# Patient Record
Sex: Male | Born: 1995 | Race: White | Hispanic: No | Marital: Single | Smoking: Former smoker
Health system: Southern US, Community
[De-identification: ages and names within clinical notes are randomized; demographics above are authoritative.]

## PROBLEM LIST (undated history)

## (undated) DIAGNOSIS — F419 Anxiety disorder, unspecified: Secondary | ICD-10-CM

## (undated) DIAGNOSIS — F32A Depression, unspecified: Secondary | ICD-10-CM

## (undated) DIAGNOSIS — F329 Major depressive disorder, single episode, unspecified: Secondary | ICD-10-CM

## (undated) HISTORY — DX: Major depressive disorder, single episode, unspecified: F32.9

## (undated) HISTORY — PX: FOOT FUSION: SHX956

## (undated) HISTORY — DX: Anxiety disorder, unspecified: F41.9

## (undated) HISTORY — DX: Depression, unspecified: F32.A

## (undated) HISTORY — PX: WISDOM TOOTH EXTRACTION: SHX21

---
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
## 2015-03-04 ENCOUNTER — Encounter (HOSPITAL_COMMUNITY): Payer: Self-pay | Admitting: Emergency Medicine

## 2015-03-04 ENCOUNTER — Other Ambulatory Visit: Payer: Self-pay | Admitting: Pediatrics

## 2015-03-04 ENCOUNTER — Ambulatory Visit (INDEPENDENT_AMBULATORY_CARE_PROVIDER_SITE_OTHER): Payer: 59 | Admitting: Physician Assistant

## 2015-03-04 ENCOUNTER — Ambulatory Visit (HOSPITAL_COMMUNITY): Payer: Self-pay | Admitting: Physician Assistant

## 2015-03-04 ENCOUNTER — Emergency Department (HOSPITAL_COMMUNITY)
Admission: EM | Admit: 2015-03-04 | Discharge: 2015-03-04 | Disposition: A | Discharge status: Home / Self Care | Payer: 59 | Attending: Emergency Medicine | Admitting: Emergency Medicine

## 2015-03-04 ENCOUNTER — Encounter (HOSPITAL_COMMUNITY): Payer: Self-pay | Admitting: Physician Assistant

## 2015-03-04 VITALS — BP 116/62 | HR 75 | Ht 68.0 in | Wt 150.0 lb

## 2015-03-04 DIAGNOSIS — Z87891 Personal history of nicotine dependence: Secondary | ICD-10-CM | POA: Diagnosis not present

## 2015-03-04 DIAGNOSIS — F411 Generalized anxiety disorder: Secondary | ICD-10-CM | POA: Insufficient documentation

## 2015-03-04 DIAGNOSIS — F329 Major depressive disorder, single episode, unspecified: Secondary | ICD-10-CM | POA: Insufficient documentation

## 2015-03-04 DIAGNOSIS — F32A Depression, unspecified: Secondary | ICD-10-CM

## 2015-03-04 DIAGNOSIS — F419 Anxiety disorder, unspecified: Secondary | ICD-10-CM | POA: Diagnosis not present

## 2015-03-04 LAB — CBC WITH DIFFERENTIAL/PLATELET
Basophils Absolute: 0 10*3/uL (ref 0.0–0.1)
Basophils Relative: 0 % (ref 0–1)
EOS PCT: 3 % (ref 0–5)
Eosinophils Absolute: 0.2 10*3/uL (ref 0.0–0.7)
HCT: 46.4 % (ref 39.0–52.0)
HEMOGLOBIN: 16.2 g/dL (ref 13.0–17.0)
Lymphocytes Relative: 27 % (ref 12–46)
Lymphs Abs: 2.1 10*3/uL (ref 0.7–4.0)
MCH: 31.5 pg (ref 26.0–34.0)
MCHC: 34.9 g/dL (ref 30.0–36.0)
MCV: 90.3 fL (ref 78.0–100.0)
Monocytes Absolute: 0.8 10*3/uL (ref 0.1–1.0)
Monocytes Relative: 10 % (ref 3–12)
NEUTROS ABS: 4.5 10*3/uL (ref 1.7–7.7)
NEUTROS PCT: 60 % (ref 43–77)
PLATELETS: 208 10*3/uL (ref 150–400)
RBC: 5.14 MIL/uL (ref 4.22–5.81)
RDW: 12.2 % (ref 11.5–15.5)
WBC: 7.7 10*3/uL (ref 4.0–10.5)

## 2015-03-04 LAB — BASIC METABOLIC PANEL
ANION GAP: 10 (ref 5–15)
BUN: 15 mg/dL (ref 6–20)
CO2: 26 mmol/L (ref 22–32)
CREATININE: 1.27 mg/dL — AB (ref 0.61–1.24)
Calcium: 9.6 mg/dL (ref 8.9–10.3)
Chloride: 104 mmol/L (ref 101–111)
GFR calc Af Amer: >60 mL/min (ref >60)
GFR calc non Af Amer: >60 mL/min (ref >60)
Glucose, Bld: 97 mg/dL (ref 65–99)
Potassium: 3.6 mmol/L (ref 3.5–5.1)
Sodium: 140 mmol/L (ref 135–145)

## 2015-03-04 LAB — SALICYLATE LEVEL: Salicylate Lvl: <4 mg/dL (ref 2.8–30.0)

## 2015-03-04 LAB — ACETAMINOPHEN LEVEL: Acetaminophen (Tylenol), Serum: <10 ug/mL — ABNORMAL LOW (ref 10–30)

## 2015-03-04 LAB — ETHANOL

## 2015-03-04 NOTE — Patient Instructions (Signed)
1. [facility].

## 2015-03-04 NOTE — Discharge Instructions (Signed)
You need to have your kidney function rechecked by her primary care doctor in one week.   Behavioral Health Resources in the [facility]  Intensive Outpatient Programs: [facility]      [address] [place], [place] [phone] Both a day and evening program       [facility]     [address]        [place], [place] [address] [phone]         ADS: [facility] [address] [place] [phone]  [facility] ACCESS LINE: [phone] or [phone] [address] [place], [place] [address] [email or web address]  Mobile Crisis Teams:                                        [facility]         Mobile Crisis Care Unit [phone]             [facility] [address] [place] [phone]                                         Interventionist [address]haron DeEsch [address] [place] [phone]  Self-Help/Support Groups: [facility]oc[facility] of [facility] [place] Variety of support groups [phone] (call for more info)  Narcotics Anonymous (NA) Caring Services [address] [place] - 2 meetings at this location  Residential Treatment Programs:  [facility]      [address]        [place]       [phone]         [facility] [address], [place] [address] [place], [place]  [address] [phone]  [facility]  [address] [place], [place] [address] [phone] Admissions: 8am-3pm M-F  [facility]     [address]        [place], [place] [address]       [phone]         [facility] [address] [place], [place] [phone]  [facility] [address] [place], [place] [phone]  [facility] - Intensive Outpatient      [address]  [address]     [place], [place]       [phone]         [facility] ([facility]     [address] [place], [place] [phone] or [phone]  [facility] Services ([facility])  [address] [place], [place] [phone]  [facility] [address]                                               [address] [place] [phone]  [facility] Resources: [phone]- [phone]               General Therapy  Angie FavaJulie Brannon, PhD        [address]                                       [place], [place] [address]         [phone]   Insurance  [facility]   [address] [place], [place] [address] [phone]  [facility] [address] [place], [place] [address] [phone] Insurance/Medicaid/sponsorship through [facility]  Faith and Families                                              [address]                                        [place], [place] [address]    Therapy/tele-psych/case         [phone]          [facility] [address]   [place], [place]  [address]  Adolescent/group home/case management [phone]                                           Creola CornJulia Brannon PhD       General therapy       Insurance   [phone]         Dr. Lolly MustacheArfeen Insurance [phone] M-F  [facility] Medicaid, sponsorship [phone]

## 2015-03-04 NOTE — BH Assessment (Signed)
Assessment Note  Nathaniel King. is an 19 y.o. male with history of Anxiety, Depression, and Bipolar Disorder. Patient presents to [facility] for a TTS assessment. Patient sts that he was sent to [facility] by Verne Spurr, PA. Sts that he had his first appointment with her at the [facility] Outpatient office. Patient sts that during today's visit he discussed his on-going issues with depression. Patient admits that he has felt depressed for the past several weeks (approx. 6-8 weeks). He feels hopeless, fatigue, isolates self from others, and worthless at times. He also reports vegetative symptoms (lack of grooming, bathing, and not wanting to get out of bed). Patient sts, "I eithter sleep to much or not enough". He has loss 55 pounds in the past year intentionally as a result of dieting. Stressors includes arguments with partner and "My mother driving me crazy".   Patient admits that he told Verne Spurr, PA about his suicidal ideations but doesn't feel as if he clearly explained himself during the appointment. Patient explains that he has fleeting suicidal thoughts on/off. Patient has not had any thoughts in the past week. Sts that when he has fleeting thoughts he sometimes thinks of a plan. Patient sts, "Althought think of plans..like overdosing.Marland KitchenMarland KitchenI would never actually hurt myself". Patient admits that he has thought about overdosing in the past, last thought was 1 week ago. Patient able to contract for safety today. He does not have any current suicidal thoughts and has not had any in the past week. Patient has no history of suicidal attempts/gestures. No self mutilating behaviors. No history of inpatient psychiatric treatment. No history of outpatient psychiatric treatment.   Patient denies HI and AVH's. Patient admits to a history of alcohol binges. His last drink was 1 month ago. He also has a history of THC use and last use was 2-3 months ago.   Writer discussed overall clinicals with  examining PA-C Hinton Lovely) and Julieanne Cotton, NP. Collaborative all parties agreed that patient does not meet criteria for inpatient treatment. Patient does however meet criteria for Psych IOP and he will start the program 03/10/2015.  Patient signed a contract for safety and his partner witnessed by also signing the document. Patient left [facility] in good spirits and appeared motivated to follow up with Psych IOP next week.   Axis I: Depressive Disorder, Anxiety Disorder NOS, Bipolar Disorder Axis II: Deferred Axis III:  Past Medical History  Diagnosis Date  . Anxiety   . Depression   . Bipolar disorder    Axis IV: other psychosocial or environmental problems, problems related to social environment, problems with access to health care services and problems with primary support group Axis V: 51-60 moderate symptoms  Past Medical History:  Past Medical History  Diagnosis Date  . Anxiety   . Depression   . Bipolar disorder     Past Surgical History  Procedure Laterality Date  . Foot fusion    . Wisdom tooth extraction      Family History:  Family History  Problem Relation Age of Onset  . Alcohol abuse Mother   . Bipolar disorder Mother   . Depression Mother   . Alcohol abuse Maternal Aunt   . Alcohol abuse Maternal Grandfather     Social History:  reports that he quit smoking about 2 months ago. His smoking use included Cigarettes. He smoked 0.25 packs per day. He has never used smokeless tobacco. He reports that he uses illicit drugs (Marijuana). He reports that he  does not drink alcohol.  Additional Social History:  Alcohol / Drug Use Pain Medications: SEE MAR Prescriptions: SEE MAR Over the Counter: SEE MAR Substance #1 Name of Substance 1: THC 1 - Age of First Use: 18 1 - Amount (size/oz): small amount 1 - Frequency: "Not that often" 1 - Duration: on-going 1 - Last Use / Amount: 3 months ago Substance #2 Name of Substance 2: Alcohol  2 - Age of First Use: 18 2 -  Amount (size/oz): social binges 2 - Frequency: social use only  2 - Duration: on-going  2 - Last Use / Amount: 1 month ago   CIWA: CIWA-Ar BP: 142/77 mmHg Pulse Rate: 89 COWS:    Allergies: No Known Allergies  Home Medications:  (Not in a hospital admission)  OB/GYN Status:  No LMP for male patient.  General Assessment Data [facility] T[facility] Assessment: In system Is this a Tele or Face-to-Face Assessment?: Face-to-Face Is this an Initial Assessment or a Re-assessment for this encounter?: Initial Assessment Marital status: Single Maiden name:  (n/a) Is patient pregnant?: No Pregnancy Status: No Living Arrangements: Parent Can pt return to current living arrangement?: No Admission Status: Voluntary Is patient capable of signing voluntary admission?: Yes Referral Source: Self/Family/Friend Insurance type:  [facility])     Crisis Care Plan Living Arrangements: Parent Name of Psychiatrist:  (No psychiatrist; seen today by Verne Spurr first visit) Name of Therapist:  (No therapist )  Education Status Is patient currently in school?: No Current Grade:  (n/a) Highest grade of school patient has completed:  (n/a) Name of school:  (n/a) Contact person:  (n/a)  Risk to self with the past 6 months Suicidal Ideation: No-Not Currently/Within Last 6 Months Has patient been a risk to self within the past 6 months prior to admission? : Yes Suicidal Intent: No-Not Currently/Within Last 6 Months Has patient had any suicidal intent within the past 6 months prior to admission? : Yes (patient reports on/off fleeting thoughts of SI x6 weeks) Is patient at risk for suicide?: No (patient able to contract for safety) Suicidal Plan?: No-Not Currently/Within Last 6 Months (plan to overdose 1 week ago; no current thoughts ) Has patient had any suicidal plan within the past 6 months prior to admission? : Yes Access to Means: Yes Specify Access to Suicidal Means:  (patient does  have access to medications at home ) What has been your use of drugs/alcohol within the last 12 months?:  (patient reports a hx of social alcohol binges and THC use ) Previous Attempts/Gestures: No How many times?:  (0) Other Self Harm Risks:  (patient denies ) Triggers for Past Attempts: Other (Comment) (patient denies previous triggers ) Intentional Self Injurious Behavior: None Family Suicide History: Unknown Recent stressful life event(s): Other (Comment) (argument with boyfriend & "My mom drives me crazy") Persecutory voices/beliefs?: No Depression: Yes Depression Symptoms: Feeling worthless/self pity, Feeling angry/irritable, Loss of interest in usual pleasures, Fatigue, Isolating, Tearfulness, Insomnia Substance abuse history and/or treatment for substance abuse?: No Suicide prevention information given to non-admitted patients: Not applicable  Risk to Others within the past 6 months Homicidal Ideation: No Does patient have any lifetime risk of violence toward others beyond the six months prior to admission? : No Thoughts of Harm to Others: No Current Homicidal Intent: No Current Homicidal Plan: No Access to Homicidal Means: No Identified Victim:  (nr/a) History of harm to others?: No Assessment of Violence: None Noted Violent Behavior Description:  (patient calm and  cooperative; very pleasant) Does patient have access to weapons?: No Criminal Charges Pending?: No Does patient have a court date: No Is patient on probation?: No  Psychosis Hallucinations: None noted Delusions: None noted  Mental Status Report Appearance/Hygiene: Improved, Other (Comment) (appropriate) Eye Contact: Good Motor Activity: Freedom of movement Speech: Logical/coherent Level of Consciousness: Alert Mood: Depressed Affect: Appropriate to circumstance Anxiety Level: None     ADLScreening [facility] Assessment Services) Patient's cognitive ability adequate to safely complete daily activities?:  Yes Patient able to express need for assistance with ADLs?: Yes Independently performs ADLs?: Yes (appropriate for developmental age)  Prior Inpatient Therapy Prior Inpatient Therapy: No Prior Therapy Dates:  (n/a) Prior Therapy Facilty/Provider(s):  (n/a) Reason for Treatment:  (n/a)  Prior Outpatient Therapy Prior Outpatient Therapy: Yes Prior Therapy Dates:  (first time visit 03/04/2015 with Verne SpurrNeil Mashburn, NP) Prior Therapy Facilty/Provider(s):  Dara Hoyer(Neil Masburn, NP [facility] [facility] Reason for Treatment:  (depression, anxiety, med managment ) Does patient have an ACCT team?: No Does patient have Intensive In-House Services?  : No Does patient have [facility] services? : No Does patient have P4CC services?: No  ADL Screening (condition at time of admission) Patient's cognitive ability adequate to safely complete daily activities?: Yes Is the patient deaf or have difficulty hearing?: No Does the patient have difficulty seeing, even when wearing glasses/contacts?: No Does the patient have difficulty concentrating, remembering, or making decisions?: Yes Patient able to express need for assistance with ADLs?: Yes Does the patient have difficulty dressing or bathing?: No Independently performs ADLs?: Yes (appropriate for developmental age) Does the patient have difficulty walking or climbing stairs?: No Weakness of Legs: None Weakness of Arms/Hands: None  Home Assistive Devices/Equipment Home Assistive Devices/Equipment: None    Abuse/Neglect Assessment (Assessment to be complete while patient is alone) Physical Abuse: Denies Verbal Abuse: Denies Sexual Abuse: Denies Exploitation of patient/patient's resources: Denies Self-Neglect: Denies Values / Beliefs Cultural Requests During Hospitalization: None Spiritual Requests During Hospitalization: None   Advance Directives (For Healthcare) Does patient have an advance directive?: No Would patient like information on  creating an advanced directive?: No - patient declined information    Additional Information 1:1 In Past 12 Months?: No CIRT Risk: No Elopement Risk: No Does patient have medical clearance?: Yes     Disposition:  Disposition Initial Assessment Completed for this Encounter: Yes Disposition of Patient: Other dispositions, Outpatient treatment (Patient referred to Psych IOP to start 03/10/2015) Type of outpatient treatment: Psych Intensive Outpatient  On Site Evaluation by:   Reviewed with Physician:    Melynda RipplePerry, Bradely Rudin [facility] 03/04/2015 2:32 PM

## 2015-03-04 NOTE — ED Notes (Signed)
TTS in with pt at this time.  

## 2015-03-04 NOTE — ED Provider Notes (Signed)
CSN: [id number]     Arrival date & time 03/04/15  1147 History  This chart was scribed for non-physician practitioner, Roxy Horseman, PA-C working with Tilden Fossa, MD by Doreatha Martin, ED scribe. This patient was seen in room [facility] and the patient's care was started at 12:49 PM  Chief Complaint  Patient presents with  . Depression   The history is provided by the patient. No language interpreter was used.    HPI Comments: Deshay Blumenfeld. is a 19 y.o. male with Hx of Bipolar Disorder and Depression who presents to the Emergency Department with a chief complaint of depression onset a few months ago. He reports associated SI, anxiety, and frequent feelings of hopelessness and worthlessness. He states that his plan for suicide would be overdosing, and that he would never actually do this, but the thought is there when he is at his lowest points. Pt was referred to the ED by his Psychiatrist with [facility] in [place], whom he saw today for the first time and expressed these feelings to. He denies self-harm, HI, drug abuse, and alcohol abuse. He denies any other symptoms or injuries.   Past Medical History  Diagnosis Date  . Anxiety   . Depression   . Bipolar disorder    Past Surgical History  Procedure Laterality Date  . Foot fusion    . Wisdom tooth extraction     Family History  Problem Relation Age of Onset  . Alcohol abuse Mother   . Bipolar disorder Mother   . Depression Mother   . Alcohol abuse Maternal Aunt   . Alcohol abuse Maternal Grandfather    History  Substance Use Topics  . Smoking status: Former Smoker -- 0.25 packs/day    Types: Cigarettes    Quit date: 12/25/2014  . Smokeless tobacco: Never Used  . Alcohol Use: No    Review of Systems  Musculoskeletal: Negative for arthralgias.  Skin: Negative for wound.  Psychiatric/Behavioral: Positive for suicidal ideas. Negative for self-injury. The patient is nervous/anxious.      Allergies  Review of patient's allergies indicates no known allergies.  Home Medications   Prior to Admission medications   Not on File   BP 142/77 mmHg  Pulse 89  Temp(Src) 98.6 F (37 C) (Oral)  Resp 14  SpO2 100% Physical Exam  Constitutional: He is oriented to person, place, and time. He appears well-developed and well-nourished. No distress.  HENT:  Head: Normocephalic and atraumatic.  Mouth/Throat: Oropharynx is clear and moist.  Eyes: Conjunctivae and EOM are normal. Pupils are equal, round, and reactive to light. Right eye exhibits no discharge. Left eye exhibits no discharge. No scleral icterus.  Neck: Normal range of motion. Neck supple. No JVD present. No tracheal deviation present.  Cardiovascular: Normal rate, regular rhythm and normal heart sounds.  Exam reveals no gallop and no friction rub.   No murmur heard. Pulmonary/Chest: Effort normal and breath sounds normal. No respiratory distress. He has no wheezes. He has no rales. He exhibits no tenderness.  Abdominal: Soft. He exhibits no distension and no mass. There is no tenderness. There is no rebound and no guarding.  Musculoskeletal: Normal range of motion. He exhibits no edema or tenderness.  Neurological: He is alert and oriented to person, place, and time.  Skin: Skin is warm and dry.  Psychiatric: He has a normal mood and affect. His behavior is normal. Judgment and thought content normal.  Nursing note and vitals reviewed.  ED Course  Procedures (including critical care time) DIAGNOSTIC STUDIES: Oxygen Saturation is 100% on RA, normal by my interpretation.    COORDINATION OF CARE: 12:56 PM Discussed treatment plan with pt at bedside and pt agreed to plan.   Labs Review Labs Reviewed - No data to display  Imaging Review No results found.   EKG Interpretation None     MDM   Final diagnoses:  Depression    Patient with depression, anxiety, and intermittent episodes of suicidal ideation.  No SI now. No specific plan. States that he never really killing himself. Seen by behavioral health team, who recommends discharge with outpatient follow-up. Outpatient resources have been provided. Patient understands and agrees with the plan. Labs are reassuring. Will discharge to home.  Creatinine noted to be mildly elevated at 1.27. Will recommend hydration, and recheck in 7 days.  I personally performed the services described in this documentation, which was scribed in my presence. The recorded information has been reviewed and is accurate.    Roxy HorsemanRobert Temica Righetti, PA-C 03/04/15 1420  Tilden FossaElizabeth Rees, MD 03/04/15 [phone]
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
## 2015-03-04 NOTE — Progress Notes (Signed)
Psychiatric Initial Adult Assessment   Patient Identification: Nathaniel PandaWilliam J Ems Jr. MRN:  [id number] Date of Evaluation:  03/04/2015 Referral Source: patient Chief Complaint:   Chief Complaint    Establish Care; Depression; Anxiety     Visit Diagnosis:    ICD-9-CM ICD-10-CM   1. Generalized anxiety disorder 300.02 F41.1   2. Depression 311 F32.9    Diagnosis:   Patient Active Problem List   Diagnosis Date Noted  . Generalized anxiety disorder [F41.1] 03/04/2015  . Depression [F32.9] 03/04/2015   History of Present Illness: Patient notes that he has had depression in the past, and is not currently being treated. He states that both his sister and his partner have noticed that he doesn't seem to be himself, he reports feeling apathetic, he has been isolating himself, feeling more depressed, some crying, not getting out of bed, not taking care of himself all of this worsening in the past 6 weeks. He notes it is worse during the Summer. He reports suicidal ideation for weeks now and has a plan to overdose. He denies previous suicide attempts, but has engaged in self harm in the past in the form of cutting. He states he hasn't done so in a while.    He states this is one of the worst episodes of depression he has had and rates it as a 8-9/10.    He denies AVH, but states he has a chronic state of anxiety.  Elements:  Location:  Out patient . Quality:  Acute. Severity:  Severe. Timing:  worsening over the previous 6 weeks.. Duration:  "years". Context:  patient now suicidal with a plan and access. Associated Signs/Symptoms: Depression Symptoms:  depressed mood, anhedonia, fatigue, feelings of worthlessness/guilt, hopelessness, recurrent thoughts of death, suicidal thoughts with specific plan, anxiety, (Hypo) Manic Symptoms:   Anxiety Symptoms:   Psychotic Symptoms:   PTSD Symptoms:   Past Medical History:  Past Medical History  Diagnosis Date  . Anxiety   . Depression   .  Bipolar disorder     Past Surgical History  Procedure Laterality Date  . Foot fusion    . Wisdom tooth extraction     Family History:  Family History  Problem Relation Age of Onset  . Alcohol abuse Mother   . Bipolar disorder Mother   . Depression Mother   . Alcohol abuse Maternal Aunt   . Alcohol abuse Maternal Grandfather    Social History:   History   Social History  . Marital Status: Single    Spouse Name: N/A  . Number of Children: N/A  . Years of Education: N/A   Social History Main Topics  . Smoking status: Former Smoker -- 0.25 packs/day    Types: Cigarettes    Quit date: 12/25/2014  . Smokeless tobacco: Never Used  . Alcohol Use: 1.2 - 4.2 oz/week    2-7 Cans of beer per week  . Drug Use: Yes    Special: Marijuana     Comment: 3-4 x a year  . Sexual Activity: Yes    Birth Control/ Protection: Condom   Other Topics Concern  . None   Social History Narrative  . None   Additional Social History: patient is a Consulting civil engineerstudent at [employer].  Musculoskeletal: Strength & Muscle Tone: within normal limits Gait & Station: normal Patient leans: N/A  Psychiatric Specialty Exam: HPI  ROS  Blood pressure 116/62, pulse 75, height 5\' 8"  (1.727 m), weight 150 lb (68.04 kg), SpO2 96 %.Body mass index is  22.81 kg/(m^2).  General Appearance: Well Groomed  Eye Contact:  Fair  Speech:  Normal Rate  Volume:  Decreased  Mood:  Depressed, Hopeless and Worthless  Affect:  Congruent  Thought Process:  Goal Directed, Intact and Linear  Orientation:  Full (Time, Place, and Person)  Thought Content:  WDL  Suicidal Thoughts:  Yes.  with intent/plan  Homicidal Thoughts:  No  Memory:  NA  Judgement:  Impaired  Insight:  Lacking  Psychomotor Activity:  Decreased  Concentration:  Poor  Recall:  Fair  Fund of Knowledge:Good  Language: Good  Akathisia:  No  Handed:    AIMS (if indicated):    Assets:  Communication Skills Desire for Improvement Financial  Resources/Insurance Housing Intimacy Leisure Time Physical Health Resilience Social Support Talents/Skills Transportation Vocational/Educational  ADL's:  Impaired  Cognition: WNL  Sleep:     Is the patient at risk to self?  Yes.   Has the patient been a risk to self in the past 6 months?  Yes.   Has the patient been a risk to self within the distant past?  Yes.   Is the patient a risk to others?  No. Has the patient been a risk to others in the past 6 months?  No. Has the patient been a risk to others within the distant past?  No.  Allergies:  No Known Allergies Current Medications: No current outpatient prescriptions on file.   No current facility-administered medications for this visit.    Previous Psychotropic Medications: No   Substance Abuse History in the last 12 months:    Consequences of Substance Abuse: Negative  Medical Decision Making:  Established Problem, Worsening (2)  Treatment Plan Summary: Patient is recommended for in patient treatment of his depression, with crisis management, safety, and stabilization. [facility] is notified, no beds currently, but expected after discharges. Patient is agreeable to go to [facility], where he will be cleared. I spoke with Rayfield Citizen RN in [place] who will notify Dr. Jannifer Franklin, or Julieanne Cotton of patient's impending arrival.  Patient and BF Lissa Hoard' is given address to [facility] for admission and agrees to go immediately.    Rona Ravens. Eduard Penkala RPAC 10:27 AM 03/04/2015

## 2015-03-10 ENCOUNTER — Encounter (HOSPITAL_COMMUNITY): Payer: Self-pay | Admitting: Psychiatry

## 2015-03-10 ENCOUNTER — Other Ambulatory Visit (HOSPITAL_COMMUNITY): Payer: 59 | Admitting: Psychiatry

## 2015-03-10 DIAGNOSIS — F331 Major depressive disorder, recurrent, moderate: Secondary | ICD-10-CM | POA: Insufficient documentation

## 2015-03-10 DIAGNOSIS — F411 Generalized anxiety disorder: Secondary | ICD-10-CM | POA: Diagnosis not present

## 2015-03-10 DIAGNOSIS — R45851 Suicidal ideations: Secondary | ICD-10-CM | POA: Diagnosis not present

## 2015-03-10 DIAGNOSIS — G4700 Insomnia, unspecified: Secondary | ICD-10-CM | POA: Diagnosis not present

## 2015-03-10 DIAGNOSIS — Z87891 Personal history of nicotine dependence: Secondary | ICD-10-CM | POA: Diagnosis not present

## 2015-03-10 NOTE — Progress Notes (Signed)
Psychiatric Initial Adult Assessment   Patient Identification: Nathaniel King. MRN:  [id number] Date of Evaluation:  03/10/2015 Referral Source: Verne Spurr Chief Complaint:  depression Visit Diagnosis:    ICD-9-CM ICD-10-CM   1. Depression, major, recurrent, moderate 296.32 F33.1    Diagnosis:   Patient Active Problem List   Diagnosis Date Noted  . Depression, major, recurrent, moderate [F33.1] 03/10/2015    Priority: Medium    Class: Acute  . Generalized anxiety disorder [F41.1] 03/04/2015  . Depression [F32.9] 03/04/2015   History of Present Illness:  Depressed once as an early teenager.  Currently depressed for no apparent reason.  Does live at home and there is stress with his mother who has mood lability of her own that feeds into his own irritability.  [name] of an acquaintance at college and has ruminated on that.  No major event or loss has occurred.  Has a new relationship and that has been helpful in getting him out of some of the depression.  Before that he was depressed every day, having trouble getting and staying asleep some nights and them sleeping 12 -14 hrs other days.  Avoiding friends and activities, no energy, no focus or concentration.  No suicidal ideation.  Has not seen a therapist and was referred to here because he has had suicidal thought during this episode though not today and no intent.  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  Needs to look for a job but no motivation or energy. Elements:  Location:  depression. Quality:  daily sadness and no energy. Severity:  some suicidal thinking but not every day and no intent. Timing:  no precipitants noted. Duration:  several months. Context:  as above. Associated Signs/Symptoms: Depression Symptoms:  depressed mood, anhedonia, insomnia, hypersomnia, fatigue, feelings of worthlessness/guilt, difficulty concentrating, suicidal thoughts without plan, anxiety, (Hypo)  Manic Symptoms:  Irritable Mood, Anxiety Symptoms:  Excessive Worry, Psychotic Symptoms:  none PTSD Symptoms: Negative  Past Medical History:  Past Medical History  Diagnosis Date  . Anxiety   . Depression   . Bipolar disorder     Past Surgical History  Procedure Laterality Date  . Foot fusion    . Wisdom tooth extraction     Family History:  Family History  Problem Relation Age of Onset  . Alcohol abuse Mother   . Bipolar disorder Mother   . Depression Mother   . Alcohol abuse Maternal Aunt   . Alcohol abuse Maternal Grandfather    Social History:   History   Social History  . Marital Status: Single    Spouse Name: N/A  . Number of Children: N/A  . Years of Education: N/A   Social History Main Topics  . Smoking status: Former Smoker -- 0.25 packs/day    Types: Cigarettes    Quit date: 12/25/2014  . Smokeless tobacco: Never Used  . Alcohol Use: No  . Drug Use: Yes    Special: Marijuana     Comment: 3-4 x a year  . Sexual Activity: Yes    Birth Control/ Protection: Condom   Other Topics Concern  . None   Social History Narrative   Additional Social History: none  Musculoskeletal: Strength & Muscle Tone: within normal limits Gait & Station: normal Patient leans: N/A  Psychiatric Specialty Exam: HPI  ROS  There were no vitals taken for this visit.There is no weight on file to calculate BMI.  General Appearance: Well Groomed  Eye Contact:  Good  Speech:  Clear and Coherent  Volume:  Normal  Mood:  Depressed  Affect:  Congruent  Thought Process:  Coherent and Logical  Orientation:  Full (Time, Place, and Person)  Thought Content:  Negative  Suicidal Thoughts:  No  Homicidal Thoughts:  No  Memory:  Immediate;   Good Recent;   Good Remote;   Good  Judgement:  Good  Insight:  Good  Psychomotor Activity:  Normal  Concentration:  Good  Recall:  Good  Fund of Knowledge:Good  Language: Good  Akathisia:  Negative  Handed:  Right  AIMS (if  indicated):  0  Assets:  Communication Skills Desire for Improvement Financial Resources/Insurance Housing Intimacy Leisure Time Physical Health Resilience Social Support Talents/Skills Transportation Vocational/Educational  ADL's:  Intact  Cognition: WNL  Sleep:  erratic   Is the patient at risk to self?  No. Has the patient been a risk to self in the past 6 months?  No. Has the patient been a risk to self within the distant past?  Yes.   Is the patient a risk to others?  No. Has the patient been a risk to others in the past 6 months?  No. Has the patient been a risk to others within the distant past?  No.  Allergies:  No Known Allergies Current Medications: No current outpatient prescriptions on file.   No current facility-administered medications for this visit.    Previous Psychotropic Medications: No   Substance Abuse History in the last 12 months:  No.  Consequences of Substance Abuse: Negative  Medical Decision Making:  Established Problem, Stable/Improving (1)  Treatment Plan Summary: daily group therapy.  Wants to try without medications    Benjaman Pott 6/14/20162:17 PM

## 2015-03-10 NOTE — Progress Notes (Signed)
Nathaniel King. is a 19 y.o., single, unemployed, Caucasian male, who was referred per by Verne Spurr, PA; treatment for worsening depressive symptoms with recent SI.  Currently denies SI.  Also, denies HI A/V hallucinations.  Other symptoms include:  Low energy, no motivation, increased sleep, isolation, irritability (verbally lashes out), anhedonia, poor concentration, ruminating thoughts, and feelings of hopelessness and helplessness.  States the above symptoms worsened six months ago.  Triggers/Stressors:  1)  Conflictual relationship with mother.  Reports mother has severe mood swings.  States mother is having difficulty with his sexual orientation.  Pt states she's been having difficulty since he came out in the ninth grade.  "My father just agrees with her for the sake of having peace in the home."  Pt resides with his parents; along with two other siblings.  2)  Unemployment.  States he is currently looking for a job.  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 Denies any prior psychiatric hospitalizations.  Has seen a therapist seven ago for counseling.  Saw Verne Spurr, PA for the first time last week.  Denies any suicide attempts; but admits to hx of cutting wrists at age 67. Family Hx:  Maternal Aunt and Maternal Grandfather (ETOH). Childhood:  Born in [place], [place].  Reports having a great childhood.  States he disclosed his sexual orientation in the ninth grade.  "I was close to my mother until I came out."  Reports mother pulled away from him.  States he was making good grades until high school.  "That's when I started suffering with depression.  Denied abuse or trauma. Siblings:  Two sisters (ages 63 and 72) and a 84 yo brother (64 and 64 resides within parent's home). Pt has been in a relationship with his partner for three months.  States he is very supportive along with pt's older sister. Drugs/ETOH:  Had his first drink at age 57; had two beers two months ago.  First used  THC at age 57.  States he has used THC up to four times since first use.  Denies any DUI's.  No current use of cigarettes (former smoker).  Denies any legal issues.  Pt completed all forms.  Scored 31 on the burns.  Pt will attend MH-IOP for ten days.  A:  Oriented pt.  Provided pt with an orientation folder.  Informed Verne Spurr, PA of admit.  Will refer pt to a therapist.  Encouraged support groups.  R:  Pt receptive.          Chestine Spore, RITA, CNA, M.Ed

## 2015-03-11 ENCOUNTER — Other Ambulatory Visit (HOSPITAL_COMMUNITY): Payer: 59 | Admitting: Psychiatry

## 2015-03-11 DIAGNOSIS — F331 Major depressive disorder, recurrent, moderate: Secondary | ICD-10-CM | POA: Diagnosis not present

## 2015-03-11 NOTE — Progress Notes (Signed)
    Daily Group Progress Note  Program: IOP  Group Time: 9:00-10:30  Participation Level: Active  Behavioral Response: Appropriate  Type of Therapy:  Group Therapy  Summary of Progress: Pt. Presented as quiet and anxious. Pt. Shared that he frequently feels "defeated" by his depression particularly on days when he wakes up feeling good and then his mood changes. Pt. Shared that he is challenged by all or nothing thinking and wanting to be able to see major changes quickly.     Group Time: 10:30-12:00  Participation Level:  Active  Behavioral Response: Appropriate  Type of Therapy: Psycho-education Group  Summary of Progress: Pt. Participated in discussion about developing health boundaries with family members.   Shaune Pollack, LPC

## 2015-03-11 NOTE — Progress Notes (Signed)
    Daily Group Progress Note  Program: IOP  Group Time: 9:00-10:30  Participation Level: Active  Behavioral Response: Appropriate  Type of Therapy:  Group Therapy  Summary of Progress: Pt. Met with case manager and psychiatrist.      Group Time: 10:30-12:00  Participation Level:  Active  Behavioral Response: Appropriate  Type of Therapy: Psycho-education Group  Summary of Progress: Pt. Discussed feeling pressure from his mother, awareness of relationship with mother as significant stressor. Pt. Participated in guided meditation activity.   Nancie Neas, LPC

## 2015-03-12 ENCOUNTER — Other Ambulatory Visit (HOSPITAL_COMMUNITY): Payer: 59 | Admitting: Psychiatry

## 2015-03-12 MED ORDER — BUPROPION HCL ER (XL) 150 MG PO TB24: 150.0000 mg | ORAL_TABLET | ORAL | Status: DC

## 2015-03-12 NOTE — Progress Notes (Signed)
    Daily Group Progress Note  Program: IOP  Group Time: 9:00-10:30  Participation Level: Active  Behavioral Response: Appropriate  Type of Therapy:  Group Therapy  Summary of Progress: Pt. Reported that he was feeling better than yesterday. Pt. Reports that he continues to have problems with sleep/not sleeping enough and waking at 5:00 in the morning and poor appetite.      Group Time: 10:30-12:00  Participation Level:  Active  Behavioral Response: Appropriate  Type of Therapy: Psycho-education Group  Summary of Progress: Pt. Watched [facility] video and participated in conversation about developing self-compassion.   Shaune Pollack, LPC

## 2015-03-12 NOTE — Addendum Note (Signed)
Addended by: Carolanne Grumbling D on: 03/12/2015 01:24 PM   Modules accepted: Orders

## 2015-03-13 ENCOUNTER — Other Ambulatory Visit (HOSPITAL_COMMUNITY): Payer: 59 | Admitting: Psychiatry

## 2015-03-13 DIAGNOSIS — F331 Major depressive disorder, recurrent, moderate: Secondary | ICD-10-CM | POA: Diagnosis not present

## 2015-03-13 NOTE — Progress Notes (Signed)
    Daily Group Progress Note  Program: IOP  Group Time:  9:00-11:00  Participation Level: Active  Behavioral Response: Appropriate  Type of Therapy:  Group Therapy  Summary of Progress: Pt. Watched and discussed [employer] video about self-compassion i.e., mindfulness, kindness to self, and common humanity. Pt. Discussed pattern of comparing self to others that affects his mood.      Group Time: 11:00-12:00  Participation Level:  Active  Behavioral Response: Appropriate  Type of Therapy: Psycho-education Group  Summary of Progress: Pt. Participated in grief and loss with Theda Belfast.   Shaune Pollack, LPC

## 2015-03-16 ENCOUNTER — Telehealth (HOSPITAL_COMMUNITY): Payer: Self-pay | Admitting: Psychiatry

## 2015-03-16 ENCOUNTER — Other Ambulatory Visit (HOSPITAL_COMMUNITY): Payer: 59 | Admitting: Psychiatry

## 2015-03-17 ENCOUNTER — Other Ambulatory Visit (HOSPITAL_COMMUNITY): Payer: 59 | Attending: Psychiatry | Admitting: Psychiatry

## 2015-03-17 DIAGNOSIS — F331 Major depressive disorder, recurrent, moderate: Secondary | ICD-10-CM | POA: Diagnosis not present

## 2015-03-17 DIAGNOSIS — F411 Generalized anxiety disorder: Secondary | ICD-10-CM

## 2015-03-18 ENCOUNTER — Other Ambulatory Visit (HOSPITAL_COMMUNITY): Payer: 59 | Admitting: Psychiatry

## 2015-03-18 DIAGNOSIS — F331 Major depressive disorder, recurrent, moderate: Secondary | ICD-10-CM

## 2015-03-18 NOTE — Progress Notes (Signed)
    Daily Group Progress Note  Program: IOP  Group Time: 9:00-10:30  Participation Level: Active  Behavioral Response: Appropriate  Type of Therapy:  Group Therapy  Summary of Progress: Pt. Spent most of the morning session with case manager processing relationship issue. Pt. Returned briefly to group and discussed fear related to entering new relationships.      Group Time: 10:30-12:00  Participation Level:  Active  Behavioral Response: Appropriate  Type of Therapy: Psycho-education Group  Summary of Progress: Pt. Participated in visualization/vision board activity.   Shaune Pollack, LPC

## 2015-03-18 NOTE — Progress Notes (Signed)
Nathaniel King. is a 19 y.o. , single, unemployed, Caucasian male, who was referred per by Nathaniel Polio, PA; treatment for worsening depressive symptoms with recent SI. Currently denies SI. Also, denies HI A/V hallucinations. Other symptoms included: Low energy, no motivation, increased sleep, isolation, irritability (verbally lashes out), anhedonia, poor concentration, ruminating thoughts, and feelings of hopelessness and helplessness. States the above symptoms worsened six months ago. Triggers/Stressors: 1) Conflictual relationship with mother. Reports mother has severe mood swings. States mother is having difficulty with his sexual orientation. Pt states she's been having difficulty since he came out in the ninth grade. "My father just agrees with her for the sake of having peace in the home." Pt resides with his parents; along with two other siblings. 2) Unemployment. States he is currently looking for a job. 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 Pt has been attending MH-IOP and has been active in the groups.  This morning his partner arrived distraught looking for patient.  He expressed that he was concerned about patient because of alarming texts that he received.  The text read that Will wanted to end their three month relationship.  "I just don't understand where he is coming from with this.  All relationships are rocky at times."  Nathaniel King (patient's partner) was very tearful.  Encouraged Nathaniel King to give each other space.  Nathaniel King left writer's office and this Probation officer met with pt.  According to pt, the relationship has been unhealthy for a while.  Pt admits to reading some of Nathaniel King's texts/emails the other day and discovered more in depth things about [place].  Pt wouldn't disclose what he read, but stated it was bad things that involved multiple partners.  "I just can't get those things out of my head.  It's just too much.  He had told me about some of those things; but not as in depth.  I  just need to focus on myself.  I need to get better."  According to pt, Nathaniel King has been involved in previous toxic relationships.  Pt mentioned that Nathaniel King acts impulsive also.  Mentioned that Nathaniel King most recently went to pt's home and told pt's mother about them being in a relationship.  "I am really upset about that because he didn't have a right to tell her." Inquired if pt wanted to do a couple's session with [place].  Pt agreed to meet with [facility].  Pt was able to explain to [place] that he felt "smothered" by him.  Stated that he wanted to work on getting himself better; without being in a relationship with [place].  Nathaniel King blamed himself and was very tearful.  He kept stating that he will do better and would give him his space, but wanted to stay in a relationship.  Pt was able to express how Nathaniel King has helped him in so many ways, but it is now up to him to finish the work only he can do.  Pt reiterated to [place] that he gives so much and has lost himself.  Pt added that they will remain friends; but not in a relationship.  Both denied SI/HI.  Discussed safety measures; if any thoughts should arise.  A:  Encouraged Nathaniel King to seek help (individual counseling/groups).  Pt inquired about groups like pt is in.  Pt states he doesn't have insurance.  Referred pt to [facility].  Appt tomorrow at 1 pm for orientation.  Provided support and feedback as needed.  Pt stated he felt like a weight  had been lifted.  R:  Both receptive.

## 2015-03-19 ENCOUNTER — Other Ambulatory Visit (HOSPITAL_COMMUNITY): Payer: 59 | Admitting: Psychiatry

## 2015-03-19 DIAGNOSIS — F331 Major depressive disorder, recurrent, moderate: Secondary | ICD-10-CM | POA: Diagnosis not present

## 2015-03-19 NOTE — Progress Notes (Signed)
    Daily Group Progress Note  Program: IOP  Group Time: 9:00-10:30  Participation Level: Active  Behavioral Response: Appropriate  Type of Therapy: Group Therapy  Summary of Progress: Pt. Presented with bright affect, talkative. Pt. Discussed difficult relationship with his mother, difficulty connecting with peers at work at school, and general anxiety related to entering new relationships.      Group Time: 10:30-12:00  Participation Level: Active  Behavioral Response: Appropriate  Type of Therapy: Psycho-education Group  Summary of Progress: Pt. Watched [employer] video about vulnerability as resistance to shame.   Shaune Pollack, LPC

## 2015-03-20 ENCOUNTER — Other Ambulatory Visit (HOSPITAL_COMMUNITY): Payer: 59 | Admitting: Psychiatry

## 2015-03-20 DIAGNOSIS — F331 Major depressive disorder, recurrent, moderate: Secondary | ICD-10-CM | POA: Diagnosis not present

## 2015-03-20 NOTE — Progress Notes (Signed)
    Daily Group Progress Note  Program: IOP  Group Time: 9:00-10:30  Participation Level: Active  Behavioral Response: Appropriate  Type of Therapy:  Group Therapy  Summary of Progress: Pt. Presented as talkative, with bright affect. Pt. Discussed fears about career choice and being overly influenced by opinions of family and friends. Pt. Discussed history of alcohol abuse and awareness that it has contributed to depression and feeling out of control.      Group Time: 10:30-12:00  Participation Level:  Active  Behavioral Response: Appropriate  Type of Therapy: Psycho-education Group  Summary of Progress: Pt. Participated in discussion about developing awareness of triggers for anxiety and depression.   Shaune Pollack, LPC

## 2015-03-20 NOTE — Progress Notes (Signed)
Date: March 20, 2015 Time: 11:00 AM-12:00PM  Purpose: To address both internal and external anger, as well as anger prevention in practice. Intervention: Applied peer recovery principles to concepts of anger management preventative strategies, assertiveness (vs. aggression or passivity), and the establishment of healthy boundaries. Effect: Will realized that practicing healthy boundaries, starting with saying "no" at times, is an essential recovery tool he needs to enact.  Donnamarie Rossetti CPSS

## 2015-03-20 NOTE — Progress Notes (Signed)
    Daily Group Progress Note  Program: IOP  Group Time: 9:00-11:00  Participation Level: Active  Behavioral Response: Appropriate  Type of Therapy:  Group Therapy  Summary of Progress: Pt. Presented as talkative, smiles, and makes appropriate eye contact with ability to receive feedback from others. Pt. Reported that he enjoys going to the gym and is one of the few places where he is able to experience joy.     Group Time: 11:00-12:00  Participation Level:  Active  Behavioral Response: Appropriate  Type of Therapy: Psycho-education Group  Summary of Progress: Pt. Participated in anger management group.  Shaune Pollack, LPC

## 2015-03-23 ENCOUNTER — Other Ambulatory Visit (HOSPITAL_COMMUNITY): Payer: 59 | Admitting: Psychiatry

## 2015-03-23 DIAGNOSIS — F331 Major depressive disorder, recurrent, moderate: Secondary | ICD-10-CM | POA: Diagnosis not present

## 2015-03-23 NOTE — Progress Notes (Signed)
    Daily Group Progress Note  Program: IOP  Group Time: 9:00-10:30  Participation Level: Active  Behavioral Response: Appropriate  Type of Therapy:  Psycho-education Group  Summary of Progress: Pt. Participated in medication education group with [place].     Group Time: 10:30-12:00  Participation Level:  Active  Behavioral Response: Appropriate  Type of Therapy: Group Therapy  Summary of Progress: Pt. Presented as talkative, smiled appropriately. Pt. Reported that he had a good weekend and helpful conversation with a friend and was able to disclose his diagnosis which was received with compassion and empathy.  Shaune Pollack, LPC

## 2015-03-24 ENCOUNTER — Other Ambulatory Visit (HOSPITAL_COMMUNITY): Payer: 59 | Admitting: Psychiatry

## 2015-03-24 DIAGNOSIS — F331 Major depressive disorder, recurrent, moderate: Secondary | ICD-10-CM

## 2015-03-25 ENCOUNTER — Other Ambulatory Visit (HOSPITAL_COMMUNITY): Payer: 59 | Admitting: Psychiatry

## 2015-03-25 DIAGNOSIS — F331 Major depressive disorder, recurrent, moderate: Secondary | ICD-10-CM

## 2015-03-25 NOTE — Progress Notes (Signed)
    Daily Group Progress Note  Program: IOP  Group Time: 9:00-10:30  Participation Level: Active  Behavioral Response: Appropriate  Type of Therapy:  Group Therapy  Summary of Progress: Pt. Presented as talkative, smiled appropriately, and made good eye contact. Pt. Reported that his mood was good and he felt good about discharging tomorrow.     Group Time: 10:30-12:00  Participation Level:  Active  Behavioral Response: Appropriate  Type of Therapy: Psycho-education Group  Summary of Progress: Pt. Watched Amy Cuddy video and discussed the use of power poses and how shifting physical posture can be used to increase self-confidence and decrease stress hormone.   Shaune PollackBrown, Jennifer B, LPC
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
## 2015-03-25 NOTE — Progress Notes (Signed)
[facility] Intensive Outpatient Program Discharge Summary  Nathaniel King [id number]  Admission date: 03/10/2015 Discharge date: 03/25/2015  Reason for admission: Diagnosis:   Patient Active Problem List     Diagnosis  Date Noted   .  Depression, major, recurrent, moderate [F33.1]  03/10/2015       Priority: Medium       Class: Acute   .  Generalized anxiety disorder [F41.1]  03/04/2015   .  Depression [F32.9]  03/04/2015    History of Present Illness:  Depressed once as an early teenager.  Currently depressed for no apparent reason.  Does live at home and there is stress with his mother who has mood lability of her own that feeds into his own irritability.  Nathaniel King of a suicide of an acquaintance at college and has ruminated on that.  No major event or loss has occurred.  Has a new relationship and that has been helpful in getting him out of some of the depression.  Before that he was depressed every day, having trouble getting and staying asleep some nights and them sleeping 12 -14 hrs other days.  Avoiding friends and activities, no energy, no focus or concentration.  No suicidal ideation.  Has not seen a therapist and was referred to here because he has had suicidal thought during this episode though not today and no intent.  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  Needs to look for a job but no motivation or energy.   Chemical Use History:Substance Abuse History in the last 12 months:  No  IOP COURSE; Nathaniel King was an active and willing participant in his therapy at [facility].Initially he rfused medication but decided to try Wellbutrin 150 mg XL.He reportsan excellent response to therapy. His hypersomnia,avoidance and anergy are gone. He is particularly pleased about the improvement in his sleep.He is looking forward to applying his new skills in managing his feelings of depression so they dont overwhelm him.  MENTAL STATUS AT  DISCHARGE General Appearance: Well Groomed  Eye Contact:  Good  Speech:  Clear and Coherent  Volume:  Normal  Mood:  Depressed on admission 8/10.Today 2/10 Affect: Congruent  Thought Process:  Coherent and Logical  Orientation:  Full (Time, Place, and Person)  Thought Content:  Negative  Suicidal Thoughts:  No  Homicidal Thoughts:  No  Memory: Immediate;   Good  Recent;  Good Remote;   Good  Judgement:  Good  Insight:  Good  Psychomotor Activity:  Normal  Concentration:  Good  Recall: Good  Fund of Knowledge:Good  Language: Good  Akathisia:  Negative  Handed:  Right  AIMS (if indicated):  0  Assets:  Communication Skills  Desire for Improvement Financial Resources/Insurance Housing Intimacy Leisure Time Physical Health Resilience Social Support Talents/Skills Transportation Vocational/Educational  ADL's:  Intact  Cognition: WNL  Sleep:markedly improved   LABS:NA  CURRENT OUTPATIENT PRESCRIPTIONS Current Medications: No current outpatient prescriptions on file. ON ADMISSION     Wellbutrin 150 mg XL q am prescribed     AXIS DIAGNOSIS: Diagnosis:   Patient Active Problem List     Diagnosis  Date Noted   .  Depression, major, recurrent, moderate [F33.1]  03/10/2015       Priority: Medium       Class: Acute   .  Generalized anxiety disorder [F41.1]  03/04/2015   .  Depression [F32.9]  03/04/2015      LEVEL OF CARE:Outpatient  DISCHARGE DESTINATION:Home  Is patient on multiple antipsychotics at discharge?:No  Has patient failed 3 or more trials of antipsychotic monotherapy ?No  Patient phone: [phone] Patient address: [address] [place], [id number]   FOLLOWUP RECOMMENDATIONS; HAS APPT Nathaniel King  Nathaniel King 03/25/2015

## 2015-03-25 NOTE — Progress Notes (Signed)
    Daily Group Progress Note  Program: IOP  Group Time: 9:00-10:30  Participation Level: Active  Behavioral Response: Appropriate  Type of Therapy:  Psycho-education Group  Summary of Progress: Pt. Watched and discussed Shawn Achor video about developing positive behaviors including journaling, meditation, physical exercise, random acts of kindness, and journaling gratitude.    Group Time: 10:30-12:00  Participation Level:  Active  Behavioral Response: Appropriate  Type of Therapy: Group Therapy  Summary of Progress: Pt. Presented as talkative, smiled appropriately, and made good eye contact. Pt. Prepared for discharge. Pt. Shared pattern of comparison of self to others and learning to accept himself.   Shaune PollackBrown, Jennifer B, LPC

## 2015-03-25 NOTE — Patient Instructions (Signed)
Patient completed MH-IOP today.  Will follow up with Verne SpurrNeil Mashburn, NP on 03-31-15 @ 1 pm and Waldron SessionSarah Soloman, LCSW on 04-02-15 @ 9 a.m.  Encouraged support groups.

## 2015-03-26 ENCOUNTER — Other Ambulatory Visit (HOSPITAL_COMMUNITY): Payer: 59

## 2015-03-27 ENCOUNTER — Other Ambulatory Visit (HOSPITAL_COMMUNITY): Payer: 59

## 2015-03-31 ENCOUNTER — Other Ambulatory Visit (HOSPITAL_COMMUNITY): Payer: 59

## 2015-03-31 ENCOUNTER — Ambulatory Visit (HOSPITAL_COMMUNITY): Payer: Self-pay | Admitting: Physician Assistant

## 2015-04-02 ENCOUNTER — Ambulatory Visit (HOSPITAL_COMMUNITY): Payer: Self-pay | Admitting: Licensed Clinical Social Worker

## 2015-04-16 ENCOUNTER — Ambulatory Visit (HOSPITAL_COMMUNITY): Payer: Self-pay | Admitting: Medical

## 2015-04-23 ENCOUNTER — Emergency Department (HOSPITAL_COMMUNITY)
Admission: EM | Admit: 2015-04-23 | Discharge: 2015-04-24 | Disposition: A | Discharge status: Other Acute Inpatient Hospital | Payer: 59 | Attending: Emergency Medicine | Admitting: Emergency Medicine

## 2015-04-23 ENCOUNTER — Encounter (HOSPITAL_COMMUNITY): Payer: Self-pay | Admitting: Emergency Medicine

## 2015-04-23 DIAGNOSIS — Z87891 Personal history of nicotine dependence: Secondary | ICD-10-CM | POA: Diagnosis not present

## 2015-04-23 DIAGNOSIS — F419 Anxiety disorder, unspecified: Secondary | ICD-10-CM | POA: Diagnosis not present

## 2015-04-23 DIAGNOSIS — F329 Major depressive disorder, single episode, unspecified: Secondary | ICD-10-CM | POA: Insufficient documentation

## 2015-04-23 DIAGNOSIS — F32A Depression, unspecified: Secondary | ICD-10-CM

## 2015-04-23 LAB — CBC
HEMATOCRIT: 47.9 % (ref 39.0–52.0)
Hemoglobin: 16.7 g/dL (ref 13.0–17.0)
MCH: 31.5 pg (ref 26.0–34.0)
MCHC: 34.9 g/dL (ref 30.0–36.0)
MCV: 90.4 fL (ref 78.0–100.0)
PLATELETS: 216 10*3/uL (ref 150–400)
RBC: 5.3 MIL/uL (ref 4.22–5.81)
RDW: 12.5 % (ref 11.5–15.5)
WBC: 8.9 10*3/uL (ref 4.0–10.5)

## 2015-04-23 LAB — COMPREHENSIVE METABOLIC PANEL
ALBUMIN: 5.3 g/dL — AB (ref 3.5–5.0)
ALT: 25 U/L (ref 17–63)
AST: 25 U/L (ref 15–41)
Alkaline Phosphatase: 82 U/L (ref 38–126)
Anion gap: 11 (ref 5–15)
BUN: 12 mg/dL (ref 6–20)
CALCIUM: 9.9 mg/dL (ref 8.9–10.3)
CHLORIDE: 104 mmol/L (ref 101–111)
CO2: 26 mmol/L (ref 22–32)
CREATININE: 1.41 mg/dL — AB (ref 0.61–1.24)
GFR calc non Af Amer: >60 mL/min (ref >60)
Glucose, Bld: 88 mg/dL (ref 65–99)
POTASSIUM: 4.1 mmol/L (ref 3.5–5.1)
Sodium: 141 mmol/L (ref 135–145)
Total Bilirubin: 1.1 mg/dL (ref 0.3–1.2)
Total Protein: 8.5 g/dL — ABNORMAL HIGH (ref 6.5–8.1)

## 2015-04-23 LAB — RAPID URINE DRUG SCREEN, HOSP PERFORMED
Amphetamines: NOT DETECTED
BARBITURATES: NOT DETECTED
Benzodiazepines: NOT DETECTED
COCAINE: NOT DETECTED
OPIATES: NOT DETECTED
TETRAHYDROCANNABINOL: NOT DETECTED

## 2015-04-23 LAB — ACETAMINOPHEN LEVEL

## 2015-04-23 LAB — ETHANOL: Alcohol, Ethyl (B): <5 mg/dL (ref <5)

## 2015-04-23 LAB — SALICYLATE LEVEL: Salicylate Lvl: <4 mg/dL (ref 2.8–30.0)

## 2015-04-23 MED ORDER — BUPROPION HCL ER (XL) 150 MG PO TB24
150.0000 mg | ORAL_TABLET | ORAL | Status: DC
Start: 2015-04-24 — End: 2015-04-24
  Filled 2015-04-23: qty 1

## 2015-04-23 MED ORDER — ONDANSETRON HCL 4 MG PO TABS: 4.0000 mg | ORAL_TABLET | Freq: Three times a day (TID) | ORAL | Status: DC | PRN

## 2015-04-23 MED ORDER — ACETAMINOPHEN 325 MG PO TABS: 650.0000 mg | ORAL_TABLET | ORAL | Status: DC | PRN

## 2015-04-23 MED ORDER — LORATADINE 10 MG PO TABS: 10.0000 mg | ORAL_TABLET | Freq: Every day | ORAL | Status: DC

## 2015-04-23 MED ORDER — ZOLPIDEM TARTRATE 5 MG PO TABS: 5.0000 mg | ORAL_TABLET | Freq: Every evening | ORAL | Status: DC | PRN

## 2015-04-23 MED ORDER — ALUM & MAG HYDROXIDE-SIMETH 200-200-20 MG/5ML PO SUSP: 30.0000 mL | ORAL | Status: DC | PRN

## 2015-04-23 MED ORDER — IBUPROFEN 200 MG PO TABS: 600.0000 mg | ORAL_TABLET | Freq: Three times a day (TID) | ORAL | Status: DC | PRN

## 2015-04-23 NOTE — ED Notes (Addendum)
Pt reports depression, increasing over the last few days. Stated that he has thoughts of harming  Self, but no plan. Denies SI/HI. Sister at bedside. Stated that he will not harm himself. Pt is alert, oriented and cooperative

## 2015-04-23 NOTE — ED Notes (Signed)
Pt presents with complaint of depression, passive SI, denies at present and sleeping a lot.  Pt reports feeling hopeless.  Denies previous SI Attempts.  Pt reports diagnosed with Depression, taking Wellbutrin at present.  Denies alcohol or drug use.  Pt AAO x 3, calm & cooperative, interactive with staff.  Monitoring for safety, Q 15 min checks in effect.

## 2015-04-23 NOTE — ED Notes (Signed)
Report called to RN [facility] [place], [facility], rm [phone].  Pending [facility] transport.
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
## 2015-04-23 NOTE — ED Notes (Signed)
[facility] transport requested. 

## 2015-04-23 NOTE — ED Provider Notes (Signed)
CSN: [id number]     Arrival date & time 04/23/15  1752 History   First MD Initiated Contact with Patient 04/23/15 1901     Chief Complaint  Patient presents with  . Depression     (Consider location/radiation/quality/duration/timing/severity/associated sxs/prior Treatment) HPI Comments: Patient presents with several weeks to months of worsening depression and thoughts of harming himself, but no definitive plan.  He states he feels worthless and lonely.  He has had increased sleeping.  His no history of prior suicide attempts.  He has been compliant with his Wellbutrin.  He feels like he cannot function some days.  She states that.   The history is provided by the patient. No language interpreter was used.    Past Medical History  Diagnosis Date  . Anxiety   . Depression    Past Surgical History  Procedure Laterality Date  . Foot fusion    . Wisdom tooth extraction     Family History  Problem Relation Age of Onset  . Alcohol abuse Maternal Aunt   . Alcohol abuse Maternal Grandfather   . Hypertension Mother    History  Substance Use Topics  . Smoking status: Former Smoker -- 0.25 packs/day    Types: Cigarettes    Quit date: 12/25/2014  . Smokeless tobacco: Never Used  . Alcohol Use: No    Review of Systems  Constitutional: Negative for fever and activity change.  HENT: Negative for congestion, facial swelling, rhinorrhea and trouble swallowing.   Eyes: Negative for photophobia and pain.  Respiratory: Negative for cough, chest tightness and shortness of breath.   Cardiovascular: Negative for leg swelling.  Gastrointestinal: Negative for nausea, vomiting, diarrhea and constipation.  Endocrine: Negative for polydipsia and polyuria.  Genitourinary: Negative for dysuria, urgency, decreased urine volume and difficulty urinating.  Musculoskeletal: Negative for back pain and gait problem.  Skin: Negative for color change, rash and wound.  Allergic/Immunologic: Negative for  immunocompromised state.  Neurological: Negative for dizziness, facial asymmetry, speech difficulty, weakness and numbness.  Psychiatric/Behavioral: Positive for suicidal ideas, sleep disturbance and dysphoric mood. Negative for confusion and decreased concentration.      Allergies  Review of patient's allergies indicates no known allergies.  Home Medications   Prior to Admission medications   Medication Sig Start Date End Date Taking? Authorizing Provider  buPROPion (WELLBUTRIN XL) 150 MG 24 hr tablet Take 1 tablet (150 mg total) by mouth every morning. 03/12/15 03/11/16 Yes Benjaman Pott, MD  cetirizine (ZYRTEC) 10 MG tablet Take 10 mg by mouth daily as needed for allergies.   Yes Historical Provider, MD   BP 119/68 mmHg  Pulse 89  Temp(Src) 99.1 F (37.3 C) (Oral)  SpO2 100% Physical Exam  Constitutional: He is oriented to person, place, and time. He appears well-developed and well-nourished. No distress.  HENT:  Head: Normocephalic and atraumatic.  Mouth/Throat: No oropharyngeal exudate.  Eyes: Pupils are equal, round, and reactive to light.  Neck: Normal range of motion. Neck supple.  Cardiovascular: Normal rate, regular rhythm and normal heart sounds.  Exam reveals no gallop and no friction rub.   No murmur heard. Pulmonary/Chest: Effort normal and breath sounds normal. No respiratory distress. He has no wheezes. He has no rales.  Abdominal: Soft. Bowel sounds are normal. He exhibits no distension and no mass. There is no tenderness. There is no rebound and no guarding.  Musculoskeletal: Normal range of motion. He exhibits no edema or tenderness.  Neurological: He is alert and oriented to person,  place, and time.  Skin: Skin is warm and dry.  Psychiatric: He is withdrawn. He exhibits a depressed mood.  Flat affect    ED Course  Procedures (including critical care time) Labs Review Labs Reviewed  CBC  URINE RAPID DRUG SCREEN, HOSP PERFORMED  COMPREHENSIVE METABOLIC  PANEL  ETHANOL  SALICYLATE LEVEL  ACETAMINOPHEN LEVEL    Imaging Review No results found.   EKG Interpretation None      MDM   Final diagnoses:  Depression    Patient presents for several weeks to months of worsening depression with feelings of hopelessness loneliness and worthlessness.  He has intermittent suicidal thoughts, though no specific plan.  He has been compliant with his Wellbutrin.  He denies drug or alcohol use.  He denies HI or AV hallucinations.  On physical exam, patient is withdrawn, with flat affect but in no acute distress.  He does not meet inpatient criteria for HI but seems severely depressed and I think would benefit from TTS eval.    Patient accepted at behavioral health observation unit under Dr.Cobos.      Toy Cookey, MD 04/23/15 2242

## 2015-04-24 ENCOUNTER — Encounter (HOSPITAL_COMMUNITY): Payer: Self-pay | Admitting: *Deleted

## 2015-04-24 ENCOUNTER — Inpatient Hospital Stay (HOSPITAL_COMMUNITY)
Admission: AD | Admit: 2015-04-24 | Discharge: 2015-04-28 | DRG: 885 | Disposition: A | Discharge status: Home / Self Care | Payer: 59 | Source: Intra-hospital | Attending: Psychiatry | Admitting: Psychiatry

## 2015-04-24 DIAGNOSIS — G4700 Insomnia, unspecified: Secondary | ICD-10-CM | POA: Diagnosis present

## 2015-04-24 DIAGNOSIS — G4710 Hypersomnia, unspecified: Secondary | ICD-10-CM | POA: Diagnosis present

## 2015-04-24 DIAGNOSIS — F411 Generalized anxiety disorder: Secondary | ICD-10-CM | POA: Diagnosis present

## 2015-04-24 DIAGNOSIS — F331 Major depressive disorder, recurrent, moderate: Principal | ICD-10-CM | POA: Diagnosis present

## 2015-04-24 DIAGNOSIS — Z8249 Family history of ischemic heart disease and other diseases of the circulatory system: Secondary | ICD-10-CM | POA: Diagnosis not present

## 2015-04-24 DIAGNOSIS — R45851 Suicidal ideations: Secondary | ICD-10-CM | POA: Diagnosis present

## 2015-04-24 DIAGNOSIS — Z87891 Personal history of nicotine dependence: Secondary | ICD-10-CM

## 2015-04-24 DIAGNOSIS — F329 Major depressive disorder, single episode, unspecified: Secondary | ICD-10-CM | POA: Diagnosis present

## 2015-04-24 LAB — TSH: TSH: 1.225 u[IU]/mL (ref 0.350–4.500)

## 2015-04-24 MED ORDER — ALUM & MAG HYDROXIDE-SIMETH 200-200-20 MG/5ML PO SUSP: 30.0000 mL | ORAL | Status: DC | PRN

## 2015-04-24 MED ORDER — LORATADINE 10 MG PO TABS
10.0000 mg | ORAL_TABLET | Freq: Every day | ORAL | Status: DC
  Administered 2015-04-24 – 2015-04-28 (×5): 10 mg via ORAL
  Filled 2015-04-24 (×7): qty 1

## 2015-04-24 MED ORDER — ARIPIPRAZOLE 2 MG PO TABS
2.0000 mg | ORAL_TABLET | Freq: Every day | ORAL | Status: DC
  Administered 2015-04-24 – 2015-04-25 (×2): 2 mg via ORAL
  Filled 2015-04-24 (×4): qty 1

## 2015-04-24 MED ORDER — ACETAMINOPHEN 325 MG PO TABS: 650.0000 mg | ORAL_TABLET | Freq: Four times a day (QID) | ORAL | Status: DC | PRN

## 2015-04-24 MED ORDER — BUPROPION HCL ER (XL) 150 MG PO TB24
150.0000 mg | ORAL_TABLET | ORAL | Status: DC
  Administered 2015-04-24: 150 mg via ORAL
  Filled 2015-04-24 (×3): qty 1

## 2015-04-24 MED ORDER — BUPROPION HCL ER (XL) 300 MG PO TB24
300.0000 mg | ORAL_TABLET | ORAL | Status: DC
  Administered 2015-04-25 – 2015-04-28 (×4): 300 mg via ORAL
  Filled 2015-04-24: qty 1
  Filled 2015-04-24: qty 3
  Filled 2015-04-24 (×3): qty 1
  Filled 2015-04-24: qty 3
  Filled 2015-04-24 (×2): qty 1

## 2015-04-24 MED ORDER — MAGNESIUM HYDROXIDE 400 MG/5ML PO SUSP: 30.0000 mL | Freq: Every day | ORAL | Status: DC | PRN

## 2015-04-24 MED ORDER — HYDROXYZINE HCL 25 MG PO TABS
25.0000 mg | ORAL_TABLET | Freq: Four times a day (QID) | ORAL | Status: DC | PRN
  Administered 2015-04-25: 25 mg via ORAL
  Filled 2015-04-24 (×2): qty 1

## 2015-04-24 NOTE — Progress Notes (Signed)
Nursing Progress Note: 7-7p  D- Mood is depressed and anxious,rates anxiety at 6/10. Pt is shy and quiet, needs encouragement to talk. Affect is blunted and appropriate. Pt is able to contract for safety. Continues to have difficulty falling asleep. Goal for today is Coping skills for depression.  A - Observed pt interacting minimally  in group and in the milieu.Support and encouragement offered, safety maintained with q 15 minutes.   R-Contracts for safety and continues to follow treatment plan, working on learning new coping skills. Educated regarding medication and anxiety.

## 2015-04-24 NOTE — Progress Notes (Signed)
Adult Psychoeducational Group Note  Date:  04/24/2015 Time:  9:26 PM  Group Topic/Focus:  Wrap-Up Group:   The focus of this group is to help patients review their daily goal of treatment and discuss progress on daily workbooks.  Participation Level:  Active  Participation Quality:  Appropriate  Affect:  Appropriate  Cognitive:  Appropriate  Insight: Appropriate and Good  Engagement in Group:  Engaged  Modes of Intervention:  Discussion  Additional Comments:  Patient rated his day a 6. Says his goal is to get on the right medication.  Natasha Mead 04/24/2015, 9:26 PM

## 2015-04-24 NOTE — [facility] Group Notes (Signed)
[facility] LCSW Aftercare Discharge Planning Group Note  04/24/2015 8:45 AM  Participation Quality: Alert, Appropriate and Oriented  Mood/Affect: Flat  Depression Rating: 5  Anxiety Rating: 5  Thoughts of Suicide: Pt denies SI/HI  Will you contract for safety? Yes  Current AVH: Pt denies  Plan for Discharge/Comments: Pt attended discharge planning group and actively participated in group. CSW discussed suicide prevention education with the group and encouraged them to discuss discharge planning and any relevant barriers. Pt expresses that he is feeling depressed; has no immediate needs.  Transportation Means: Pt reports access to transportation  Supports: No supports mentioned at this time  Chad Cordial, LCSWA 04/24/2015 11:58 AM

## 2015-04-24 NOTE — Progress Notes (Signed)
Recreation Therapy Notes  Date: 07.29.16 Time: 9:30 am Location: [address] Group Room  Group Topic: Stress Management  Goal Area(s) Addresses:  Patient will verbalize importance of using healthy stress management.  Patient will identify positive emotions associated with healthy stress management.   Intervention: Stress Management  Activity :  Progressive Muscle Relaxation.  LRT introduced and educated patients on the technique of progressive muscle relaxation.  A script was used to deliver the technique to the patients.  Patients were asked to follow the script read a loud by the LRT to engage in practicing the stress management technique.  Education:  Stress Management, Discharge Planning.   Education Outcome: Acknowledges edcuation/In group clarification offered/Needs additional education  Clinical Observations/Feedback: Patient did not attend group.   Daysi Boggan, LRT/CTRS         Debarah Mccumbers A 04/24/2015 3:10 PM 

## 2015-04-24 NOTE — [facility] Suicide Risk Assessment (Signed)
[facility] Admission Suicide Risk Assessment   Nursing information obtained from:  Patient Demographic factors:  Male, Caucasian, Unemployed Current Mental Status:  NA Loss Factors:  Decrease in vocational status Historical Factors:  Family history of mental illness or substance abuse Risk Reduction Factors:  Living with another person, especially a relative Total Time spent with patient: 45 minutes Principal Problem: <principal problem not specified> Diagnosis:   Patient Active Problem List   Diagnosis Date Noted  . Major depression, single episode [F32.9] 04/24/2015  . Depression, major, recurrent, moderate [F33.1] 03/10/2015    Class: Acute  . Generalized anxiety disorder [F41.1] 03/04/2015  . Depression [F32.9] 03/04/2015     Continued Clinical Symptoms:  Alcohol Use Disorder Identification Test Final Score (AUDIT): 0 The "Alcohol Use Disorders Identification Test", Guidelines for Use in Primary Care, Second Edition.  World Science writer [facility]). Score between 0-7:  no or low risk or alcohol related problems. Score between 8-15:  moderate risk of alcohol related problems. Score between 16-19:  high risk of alcohol related problems. Score 20 or above:  warrants further diagnostic evaluation for alcohol dependence and treatment.   CLINICAL FACTORS:   Depression:   Impulsivity   Musculoskeletal: Strength & Muscle Tone: within normal limits Gait & Station: normal Patient leans: normal  Psychiatric Specialty Exam: Physical Exam  Review of Systems  Constitutional: Negative.   HENT: Negative.   Eyes: Negative.   Respiratory: Negative.   Cardiovascular: Negative.   Gastrointestinal: Negative.   Genitourinary: Negative.   Musculoskeletal: Negative.   Skin: Negative.   Neurological: Negative.   Endo/Heme/Allergies: Negative.   Psychiatric/Behavioral: Positive for depression. The patient is nervous/anxious and has insomnia.     Blood pressure 132/86, pulse 89,  temperature 98.4 F (36.9 C), temperature source Oral, resp. rate 16, height 5' 5.5" (1.664 m), weight 66.679 kg (147 lb).Body mass index is 24.08 kg/(m^2).  General Appearance: Fairly Groomed  Patent attorney::  Fair  Speech:  Clear and Coherent  Volume:  Normal  Mood:  Anxious and Depressed  Affect:  Depressed  Thought Process:  Coherent and Goal Directed  Orientation:  Full (Time, Place, and Person)  Thought Content:  symptoms events worries concerns  Suicidal Thoughts:  not right now  Homicidal Thoughts:  No  Memory:  Immediate;   Fair Recent;   Fair Remote;   Fair  Judgement:  Fair  Insight:  Present  Psychomotor Activity:  Normal  Concentration:  Fair  Recall:  [name]:Fair  Language: Fair  Akathisia:  No  Handed:  Right  AIMS (if indicated):     Assets:  Desire for Improvement  Sleep:  Number of Hours: 4  Cognition: WNL  ADL's:  Intact     COGNITIVE FEATURES THAT CONTRIBUTE TO RISK:  None    SUICIDE RISK:   Moderate:  Frequent suicidal ideation with limited intensity, and duration, some specificity in terms of plans, no associated intent, good self-control, limited dysphoria/symptomatology, some risk factors present, and identifiable protective factors, including available and accessible social support. 19 Y/o male who states he came out to his family when he was in 9th grade. He states that his mother did not accept it. It was years later that she came around to accept him. He states that he suffered a lot of bullying when he was in middle school. He states he experiences an underlying depressed mood that sometimes gets worst. The worst part can go on for weeks and at the very worst of  it he experiences suicidal ideas. He states he does not experience them often and that he ha never acted on them. He is currently at [employer] having transferred from the community college. He is in a relationship with a male who he states loves very much. He was working at [employer]  and impulsively quit. States this has happened before he cant stay at a place for too long. States he likes to do things at the spare of the moment. But will not acknowledge mood swings suggestive of bipolarity He admits to cognitive distortions; should statement, catastrophic thinking, personalization, filtering etc. He has been on Wellbutrin Xl 150 mg for couple of months now PLAN OF CARE: Supportive approach/coping skills                              Depression; will increase the Wellbutrin to 300 mg XL                              Will augment with Abilify 2 mg                             Will work with CBT to challenge the distortions  Medical Decision Making:  Review of Psycho-Social Stressors (1), Review or order clinical lab tests (1) and Review of Medication Regimen & Side Effects (2)  I certify that inpatient services furnished can reasonably be expected to improve the patient's condition.   Navarro Nine A 04/24/2015, 2:54 PM

## 2015-04-24 NOTE — [facility] Group Notes (Signed)
[facility] LCSW Group Therapy 04/24/2015 1:15pm  Type of Therapy: Group Therapy- Feelings Around Relapse and Recovery  Participation Level: Minimal  Participation Quality:  Reserved  Affect:  Appropriate  Cognitive: Alert and Oriented   Insight:  Developing   Engagement in Therapy: Developing/Improving and Engaged   Modes of Intervention: Clarification, Confrontation, Discussion, Education, Exploration, Limit-setting, Orientation, Problem-solving, Rapport Building, Dance movement psychotherapist, Socialization and Support  Summary of Progress/Problems: The topic for today was feelings about relapse. The group discussed what relapse prevention is to them and identified triggers that they are on the path to relapse. Members also processed their feeling towards relapse and were able to relate to common experiences. Group also discussed coping skills that can be used for relapse prevention.  Although Pt did not participate in group discussion, he was attentive and engaged AEB his maintained eye contact and non-verbal affirming gestures.   Therapeutic Modalities:   Cognitive Behavioral Therapy Solution-Focused Therapy Assertiveness Training Relapse Prevention Therapy    Nathaniel King [phone] 04/24/2015 3:39 PM

## 2015-04-24 NOTE — Progress Notes (Signed)
D: Pt is a 19 year old male admitted to [facility] voluntarily for increased depression.  Pt forwards little information during assessment.  He reports he is here because "severe depression, I've just had this for a while."  Pt denies SI during assessment.  Per report, pt has had passive SI over the past few months.  Pt denies HI, denies hallucinations.  Pt has depressed, anxious affect and mood.  He denies pain.  Pt reports that his main stressors are "keeping and maintaining a job; school because I haven't made good grades."  Pt reports de-escalation skills of: listening to music, "jog or walk," "being left alone."  Pt identifies his friend and family as his support system.  He reports his goals while at [facility] are: "getting my life back together, getting better."   A: Introduced self to pt.  Encouraged, supported, and actively listened to pt.  Pt provided with meal and beverage.  Admission process and paperwork completed with pt.  Non-invasive body assessment completed.  Pt a scar on each ankle and a scar on each leg behind his knee.  He reports these are from surgery that he had "years ago."  Pt oriented to unit.   R: Pt is cooperative with admission process.  He verbally contracts for safety.  Pt reports that he will notify staff of needs and concerns.  Pt ate his meal and then went to his room.  Will continue to monitor and assess for safety.
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
## 2015-04-24 NOTE — H&P (Signed)
Psychiatric Admission Assessment Adult  Patient Identification: Nathaniel King.  MRN:  [id number]  Date of Evaluation:  04/24/2015  Chief Complaint:  Major Depressive Disorder  Principal Diagnosis: <principal problem not specified>  Diagnosis:   Patient Active Problem List   Diagnosis Date Noted  . Major depression, single episode [F32.9] 04/24/2015  . Depression, major, recurrent, moderate [F33.1] 03/10/2015    Class: Acute  . Generalized anxiety disorder [F41.1] 03/04/2015  . Depression [F32.9] 03/04/2015   History of Present Illness: Nathaniel King is a 19 year old caucasian male. Admitted to the [facility] from the [facility] long hospital with reports of worsening symptoms of depression & suicidal ideations without plans or intent. He reports, "My sister took me to the ED last night. It was just due to severe depression. My depression worsened 3 days ago without any trigger. I have been depressed since age 16. I don't know why I feel so depressed from a young age. I really think that I have bipolar disorder, although it has not been diagnosed. I'm not always feeling depressed, it just that when I'm happy, I feel so happy that I will lose control of myself like spending a lot of money. I started depression treatment last June with medication regimen called Wellbutrin XL 150 mg. However, I have not noticed any changes or improvement in my mood. I sleep too much. I have not had a job since January of this year. I want to be able to have a job, make some money. When I get into my Major depressive mood, I feel lonely because I shot out of the rest of my people. I don't interact with anyone when I feel very depressed. The suicidal thoughts are more or less fleeting thoughts, no actual plans or intent. I have not attempted suicide. I'm receiving counseling sessions at the [facility] outpatient clinic with [facility]. I don't really know anyone in my family with mental illness, except my sister, she has dealt with anxiety &  my mother can be unpredictable with her mood".  Elements:  Location:  Major depressive disorder, recurrent episodes. Quality:  Suicidal ideations, depressed mood. Severity:  Severe. Timing:  Acutely current. Duration:  Chronic, continuous. Context:  "Worsening depression, developed suicidal ideations".  Associated Signs/Symptoms:  Depression Symptoms:  depressed mood, hopelessness, anxiety,  (Hypo) Manic Symptoms:  Elevated Mood, Impulsivity,  Anxiety Symptoms:  Excessive Worry,  Psychotic Symptoms:  Paranoia (feels like people are taliking about him).  PTSD Symptoms: NA  Total Time spent with patient: 1 hour  Past Medical History:  Past Medical History  Diagnosis Date  . Anxiety   . Depression     Past Surgical History  Procedure Laterality Date  . Foot fusion    . Wisdom tooth extraction     Family History:  Family History  Problem Relation Age of Onset  . Alcohol abuse Maternal Aunt   . Alcohol abuse Maternal Grandfather   . Hypertension Mother    Social History:  History  Alcohol Use No     History  Drug Use  . Yes  . Special: Marijuana    Comment: 3-4 x a year    History   Social History  . Marital Status: Single    Spouse Name: N/A  . Number of Children: N/A  . Years of Education: N/A   Social History Main Topics  . Smoking status: Former Smoker -- 0.25 packs/day    Types: Cigarettes    Quit date: 12/25/2014  . Smokeless tobacco:  Never Used  . Alcohol Use: No  . Drug Use: Yes    Special: Marijuana     Comment: 3-4 x a year  . Sexual Activity: Yes    Birth Control/ Protection: Condom   Other Topics Concern  . None   Social History Narrative   Additional Social History: Pain Medications: None Prescriptions: denies Over the Counter: denies History of alcohol / drug use?: No history of alcohol / drug abuse  Musculoskeletal: Strength & Muscle Tone: within normal limits Gait & Station: normal Patient leans: N/A  Psychiatric  Specialty Exam: Physical Exam  Constitutional: He is oriented to person, place, and time. He appears well-developed and well-nourished.  HENT:  Head: Normocephalic.  Eyes: Pupils are equal, round, and reactive to light.  Neck: Normal range of motion.  Cardiovascular: Normal rate.   Respiratory: Effort normal and breath sounds normal.  GI: Soft. Bowel sounds are normal.  Genitourinary:  Denies any issues in this area  Musculoskeletal: Normal range of motion.  Neurological: He is alert and oriented to person, place, and time.  Skin: Skin is warm and dry.  Psychiatric: His speech is normal and behavior is normal. Judgment normal. His mood appears anxious. His affect is not angry, not blunt, not labile and not inappropriate. Thought content is paranoid. Cognition and memory are normal. He exhibits a depressed mood.    Review of Systems  Constitutional: Negative.   Eyes: Negative.   Respiratory: Negative.   Cardiovascular: Negative.   Gastrointestinal: Negative.   Genitourinary: Negative.   Skin: Negative.   Neurological: Negative.   Endo/Heme/Allergies: Negative.   Psychiatric/Behavioral: Positive for depression. Negative for hallucinations, memory loss and substance abuse. The patient is nervous/anxious and has insomnia.     Blood pressure 132/86, pulse 89, temperature 98.4 F (36.9 C), temperature source Oral, resp. rate 16, height 5' 5.5" (1.664 m), weight 66.679 kg (147 lb).Body mass index is 24.08 kg/(m^2).  General Appearance: Casual  Eye Contact::  Good  Speech:  Clear and Coherent  Volume:  Normal  Mood:  Depressed and rates at #5  Affect:  Flat  Thought Process:  Coherent, Goal Directed and Logical  Orientation:  Full (Time, Place, and Person)  Thought Content:  Rumination and denies any hallucinations, delusional thoughts, admits feelings of paranoia.  Suicidal Thoughts:  Yes.  without intent/plan (Fleeting thoughts).  Homicidal Thoughts:  No  Memory:  Grossly intact   Judgement:  Intact  Insight:  Present  Psychomotor Activity:  Normal  Concentration:  Good  Recall:  Good  Fund of Knowledge:Fair  Language: Good  Akathisia:  No  Handed:  Right  AIMS (if indicated):     Assets:  Communication Skills Desire for Improvement Physical Health Social Support  ADL's:  Intact  Cognition: WNL  Sleep:  Number of Hours: 4   Risk to Self: Is patient at risk for suicide?: Yes Risk to Others: No Prior Inpatient Therapy: No Prior Outpatient Therapy: Yes  Alcohol Screening: 1. How often do you have a drink containing alcohol?: Never 2. How many drinks containing alcohol do you have on a typical day when you are drinking?: 1 or 2 (pt reports he doesn't drink) 3. How often do you have six or more drinks on one occasion?: Never Preliminary Score: 0 4. How often during the last year have you found that you were not able to stop drinking once you had started?: Never 5. How often during the last year have you failed to do what was  normally expected from you becasue of drinking?: Never 6. How often during the last year have you needed a first drink in the morning to get yourself going after a heavy drinking session?: Never 7. How often during the last year have you had a feeling of guilt of remorse after drinking?: Never 8. How often during the last year have you been unable to remember what happened the night before because you had been drinking?: Never 9. Have you or someone else been injured as a result of your drinking?: No 10. Has a relative or friend or a doctor or another health worker been concerned about your drinking or suggested you cut down?: No Alcohol Use Disorder Identification Test Final Score (AUDIT): 0 Brief Intervention: AUDIT score less than 7 or less-screening does not suggest unhealthy drinking-brief intervention not indicated  Allergies:  No Known Allergies  Lab Results:  Results for orders placed or performed during the hospital encounter  of 04/24/15 (from the past 48 hour(s))  TSH     Status: None   Collection Time: 04/24/15  6:10 AM  Result Value Ref Range   TSH 1.225 0.350 - 4.500 uIU/mL    Comment: Performed at [facility]   Current Medications: Current Facility-Administered Medications  Medication Dose Route Frequency Provider Last Rate Last Dose  . acetaminophen (TYLENOL) tablet 650 mg  650 mg Oral Q6H PRN Worthy Flank, NP      . alum & mag hydroxide-simeth (MAALOX/MYLANTA) 200-200-20 MG/5ML suspension 30 mL  30 mL Oral Q4H PRN Worthy Flank, NP      . buPROPion (WELLBUTRIN XL) 24 hr tablet 150 mg  150 mg Oral BH-q7a Worthy Flank, NP   150 mg at 04/24/15 0800  . hydrOXYzine (ATARAX/VISTARIL) tablet 25 mg  25 mg Oral Q6H PRN Worthy Flank, NP      . loratadine (CLARITIN) tablet 10 mg  10 mg Oral Daily Worthy Flank, NP   10 mg at 04/24/15 0800  . magnesium hydroxide (MILK OF MAGNESIA) suspension 30 mL  30 mL Oral Daily PRN Worthy Flank, NP       PTA Medications: Prescriptions prior to admission  Medication Sig Dispense Refill Last Dose  . buPROPion (WELLBUTRIN XL) 150 MG 24 hr tablet Take 1 tablet (150 mg total) by mouth every morning. 30 tablet 2 04/23/2015 at Unknown time  . cetirizine (ZYRTEC) 10 MG tablet Take 10 mg by mouth daily as needed for allergies.   Past Week at Unknown time   Previous Psychotropic Medications: Yes   Substance Abuse History in the last 12 months:  No.  Consequences of Substance Abuse: Medical Consequences:  Liver damage, Possible death by overdose Legal Consequences:  Arrests, jail time, Loss of driving privilege. Family Consequences:  Family discord, divorce and or separation.  Results for orders placed or performed during the hospital encounter of 04/24/15 (from the past 72 hour(s))  TSH     Status: None   Collection Time: 04/24/15  6:10 AM  Result Value Ref Range   TSH 1.225 0.350 - 4.500 uIU/mL    Comment: Performed at [facility]    Observation Level/Precautions:  15 minute checks  Laboratory:  Per ED  Psychotherapy:  Group sessions  Medications: See medication lists  Consultations: As needed  Discharge Concerns: mood stability  Estimated LOS: 5-7 days  Other:     Psychological Evaluations: Yes   Treatment Plan Summary: Daily contact with patient to assess  and evaluate symptoms and progress in treatment and Medication management: 1. Admit for crisis management and stabilization, estimated length of stay 3-5 days.  2. Medication management to reduce current symptoms to base line and improve the patient's overall level of functioning; resume Wellbutrin XL 150 mg for depression, Abilify 2 mg as an adjunct to Wellbutrin for depression, Hydroxyzine 25 mg prn for anxiety.  3. Treat health problems as indicated; resume Claritin 10 mg for allergies. 4. Develop treatment plan to decrease risk of relapse upon discharge and the need for readmission.  5. Psycho-social education regarding relapse prevention and self care.  6. Health care follow up as needed for medical problems.  7. Review, reconcile, and reinstate any pertinent home medications for other health issues where appropriate. 8. Call for consults with hospitalist for any additional specialty patient care services as needed.  Medical Decision Making:  New problem, with additional work up planned, Review of Psycho-Social Stressors (1), Review or order clinical lab tests (1), Review and summation of old records (2), Review of Medication Regimen & Side Effects (2) and Review of New Medication or Change in Dosage (2)  I certify that inpatient services furnished can reasonably be expected to improve the patient's condition.   Sanjuana Kava, PMHNP, FNP-BC 7/29/201610:52 AM I personally assessed the patient, reviewed the physical exam and labs and formulated the treatment plan Madie Reno A. Dub Mikes, M.D.

## 2015-04-24 NOTE — Tx Team (Signed)
/  Initial Interdisciplinary Treatment Plan   PATIENT STRESSORS: Educational concerns Financial difficulties Occupational concerns   PATIENT STRENGTHS: Ability for insight Average or above average intelligence Communication skills General fund of knowledge Motivation for treatment/growth   PROBLEM LIST: Problem List/Patient Goals Date to be addressed Date deferred Reason deferred Estimated date of resolution  "getting my life back together"  04/24/15     "getting better"  04/24/15     depression 04/24/15                                          DISCHARGE CRITERIA:  Improved stabilization in mood, thinking, and/or behavior Need for constant or close observation no longer present  PRELIMINARY DISCHARGE PLAN: Outpatient therapy Return to previous living arrangement  PATIENT/FAMIILY INVOLVEMENT: This treatment plan has been presented to and reviewed with the patient, Nathaniel King..  The patient and family have been given the opportunity to ask questions and make suggestions.  Arrie Aran 04/24/2015, 1:33 AM

## 2015-04-25 NOTE — Progress Notes (Signed)
Pt reports he is doing better this evening, though is still feeling rather anxious.  He says he had a good visit with some of his family and that was encouraging to him.  He denies SI/HI/AVH with Clinical research associate.  Pt said he went to groups today.  He hopes to find the right medication that will help him stay out of the hospital.  Pt was encouraged to make his needs and concerns known to staff.  Support and encouragement offered.  Pt plans to return home at discharge.  Safety maintained with q15 minute checks.

## 2015-04-25 NOTE — Progress Notes (Signed)
D.  Pt quiet but pleasant on approach, denies complaints at this time.  Positive for evening wrap up group, interacting appropriately within milieu.  Denies SI/HI/hallucinatons at this time.  A.  Support and encouragement offered, medications given as ordered  R.  Pt remains safe on unit, will continue to monitor

## 2015-04-25 NOTE — Plan of Care (Signed)
Problem: Alteration in mood Goal: STG-Patient reports thoughts of self-harm to staff Outcome: Progressing Patient contract for safety.

## 2015-04-25 NOTE — [facility] Group Notes (Signed)
[facility] Group Notes:  (Nursing/MHT/Case Management/Adjunct)  Date:  04/25/2015  Time:  0900  Type of Therapy:  Psychoeducational Skills  Participation Level:  Minimal  Participation Quality:  Appropriate  Affect:  Appropriate  Cognitive:  Alert  Insight:  Appropriate  Engagement in Group:  Resistant  Modes of Intervention:  Support  Summary of Progress/Problems: Patient agreed to work on staying positive.  Mickie Bail 04/25/2015, 10:15 AM

## 2015-04-25 NOTE — Progress Notes (Signed)
D: Patient is alert and cooperative.  Appetite is good with adequate fluid intake.  Patient contract for safety.  Patient states goal for today is to stay positive and plan to get better.  Attended group and participated.  Patient is quiet and keeps to himself.  Denies any pain or discomfort. Maintained on every 15 minutes checks for safety per protocol. A: Patient compliant with medication and treatment management. R:Patient support and encouragement offered.

## 2015-04-25 NOTE — Plan of Care (Signed)
Problem: Ineffective individual coping Goal: STG: Pt will be able to identify effective and ineffective STG: Pt will be able to identify effective and ineffective coping patterns  Outcome: Progressing Patient attended group and participated.

## 2015-04-25 NOTE — [facility] Group Notes (Signed)
[facility] Group Notes:  (Nursing/MHT/Case Management/Adjunct)  Date:  04/25/2015  Time:  11:19 PM  Type of Therapy:  Psychoeducational Skills  Participation Level:  Active  Participation Quality:  Appropriate, Attentive and Supportive  Affect:  Appropriate  Cognitive:  Alert and Oriented  Insight:  Good  Engagement in Group:  Engaged and Supportive  Modes of Intervention:  Discussion, Exploration and Support  Summary of Progress/Problems: Pt.  Reports he has been journaling since his admission and it does help. Pt. Is pleasant and appropriate, supportive of her peers.   Cooper Render 04/25/2015, 11:19 PM

## 2015-04-25 NOTE — Progress Notes (Signed)
[facility] MD Progress Note  04/25/2015 4:28 PM Nathaniel King.  MRN:  [id number] Subjective:   Patient states "I am here for depression. I knew something was not right with me for a while. My depression is a little better so far. I think the adjustments were good. I just woke up in the middle of the night with my heart palpitating. It has not happen before. I think I felt strange in a new place."  Objective:  Patient is seen and chart is reviewed. Nathaniel King is a 19 year old male who states he came out to his family when he was in 9th grade. He states that his mother did not accept it. It was years later that she came around to accept him. He states that he suffered a lot of bullying when he was in middle school. He states he experiences an underlying depressed mood that sometimes gets worst. The worst part can go on for weeks and at the very worst of it he experiences suicidal ideas. He states he does not experience them often and that he ha never acted on them. He is currently at [employer] having transferred from the community college. He is in a relationship with a male who he states loves very much. He was working at [employer] and impulsively quit. States this has happened before he cant stay at a place for too long. States he likes to do things at the spare of the moment. But will not acknowledge mood swings suggestive of bipolarity. He admits to cognitive distortions; should statement, catastrophic thinking, personalization, filtering etc. He has been on Wellbutrin Xl 150 mg for couple of months now. Yesterday his Wellbutrin was increased and Abilify was added. Patient appears depressed and has been struggling with symptoms of anxiety since being in the hospital. He appears to have continued symptoms of depression to include low motivation, hypersomnia, hopelessness and passive SI.   Principal Problem: Depression, major, recurrent, moderate Diagnosis:   Patient Active Problem List   Diagnosis Date Noted   . Major depression, single episode [F32.9] 04/24/2015  . Depression, major, recurrent, moderate [F33.1] 03/10/2015    Class: Acute  . Generalized anxiety disorder [F41.1] 03/04/2015  . Depression [F32.9] 03/04/2015   Total Time spent with patient: 20 minutes  Past Medical History:  Past Medical History  Diagnosis Date  . Anxiety   . Depression     Past Surgical History  Procedure Laterality Date  . Foot fusion    . Wisdom tooth extraction     Family History:  Family History  Problem Relation Age of Onset  . Alcohol abuse Maternal Aunt   . Alcohol abuse Maternal Grandfather   . Hypertension Mother    Social History:  History  Alcohol Use No     History  Drug Use  . Yes  . Special: Marijuana    Comment: 3-4 x a year    History   Social History  . Marital Status: Single    Spouse Name: N/A  . Number of Children: N/A  . Years of Education: N/A   Social History Main Topics  . Smoking status: Former Smoker -- 0.25 packs/day    Types: Cigarettes    Quit date: 12/25/2014  . Smokeless tobacco: Never Used  . Alcohol Use: No  . Drug Use: Yes    Special: Marijuana     Comment: 3-4 x a year  . Sexual Activity: Yes    Birth Control/ Protection: Condom   Other  Topics Concern  . None   Social History Narrative   Additional History:    Sleep: Fair  Appetite:  Fair   Assessment:   Musculoskeletal: Strength & Muscle Tone: within normal limits Gait & Station: normal Patient leans: N/A   Psychiatric Specialty Exam: Physical Exam  Review of Systems  Constitutional: Negative.   HENT: Negative.   Eyes: Negative.   Respiratory: Negative.   Cardiovascular: Negative.   Gastrointestinal: Negative.   Genitourinary: Negative.   Musculoskeletal: Negative.   Skin: Negative.   Neurological: Negative.   Endo/Heme/Allergies: Negative.   Psychiatric/Behavioral: Positive for depression and suicidal ideas. Negative for hallucinations, memory loss and substance  abuse. The patient is nervous/anxious and has insomnia.     Blood pressure 117/82, pulse 115, temperature 97.5 F (36.4 C), temperature source Oral, resp. rate 16, height 5' 5.5" (1.664 m), weight 66.679 kg (147 lb).Body mass index is 24.08 kg/(m^2).  General Appearance: Casual  Eye Contact::  Fair  Speech:  Clear and Coherent and Slow  Volume:  Normal  Mood:  Dysphoric  Affect:  Constricted  Thought Process:  Coherent and Goal Directed  Orientation:  Full (Time, Place, and Person)  Thought Content:  Symptoms, worries, events  Suicidal Thoughts:  Yes.  without intent/plan  Homicidal Thoughts:  No  Memory:  Immediate;   Good Recent;   Fair Remote;   Fair  Judgement:  Fair  Insight:  Present  Psychomotor Activity:  Normal  Concentration:  Fair  Recall:  [name]:Good  Language: Good  Akathisia:  No  Handed:  Right  AIMS (if indicated):     Assets:  Communication Skills Desire for Improvement Leisure Time Physical Health Talents/Skills Vocational/Educational  ADL's:  Intact  Cognition: WNL  Sleep:  Number of Hours: 4     Current Medications: Current Facility-Administered Medications  Medication Dose Route Frequency Provider Last Rate Last Dose  . acetaminophen (TYLENOL) tablet 650 mg  650 mg Oral Q6H PRN Worthy Flank, NP      . alum & mag hydroxide-simeth (MAALOX/MYLANTA) 200-200-20 MG/5ML suspension 30 mL  30 mL Oral Q4H PRN Worthy Flank, NP      . ARIPiprazole (ABILIFY) tablet 2 mg  2 mg Oral QHS Sanjuana Kava, NP   2 mg at 04/24/15 2123  . buPROPion (WELLBUTRIN XL) 24 hr tablet 300 mg  300 mg Oral BH-q7a Rachael Fee, MD   300 mg at 04/25/15 1610  . hydrOXYzine (ATARAX/VISTARIL) tablet 25 mg  25 mg Oral Q6H PRN Worthy Flank, NP      . loratadine (CLARITIN) tablet 10 mg  10 mg Oral Daily Worthy Flank, NP   10 mg at 04/25/15 0759  . magnesium hydroxide (MILK OF MAGNESIA) suspension 30 mL  30 mL Oral Daily PRN Worthy Flank, NP        Lab  Results:  Results for orders placed or performed during the hospital encounter of 04/24/15 (from the past 48 hour(s))  TSH     Status: None   Collection Time: 04/24/15  6:10 AM  Result Value Ref Range   TSH 1.225 0.350 - 4.500 uIU/mL    Comment: Performed at [facility]    Physical Findings: AIMS: Facial and Oral Movements Muscles of Facial Expression: None, normal Lips and Perioral Area: None, normal Jaw: None, normal Tongue: None, normal,Extremity Movements Upper (arms, wrists, hands, fingers): None, normal Lower (legs, knees, ankles, toes): None, normal, Trunk Movements Neck,  shoulders, hips: None, normal, Overall Severity Severity of abnormal movements (highest score from questions above): None, normal Incapacitation due to abnormal movements: None, normal Patient's awareness of abnormal movements (rate only patient's report): No Awareness, Dental Status Current problems with teeth and/or dentures?: No Does patient usually wear dentures?: No  CIWA:  CIWA-Ar Total: 1 COWS:  COWS Total Score: 2  Treatment Plan Summary: Daily contact with patient to assess and evaluate symptoms and progress in treatment and Medication management  Continue crisis management and stabilization.  Medication management:  resume Wellbutrin XL 150 mg for depression, Abilify 2 mg as an adjunct to Wellbutrin for depression, Hydroxyzine 25 mg prn for anxiety.  Encouraged patient to attend groups and participate in group counseling sessions and activities.  Discharge plan in progress.  Continue current treatment plan.  Address health issues: EKG done WNL. Vitals reviewed and stable.  Will work with CBT to challenge the distortions  Medical Decision Making:  Established Problem, Stable/Improving (1), Review of Psycho-Social Stressors (1), Review or order clinical lab tests (1), Review of Medication Regimen & Side Effects (2) and Review of New Medication or Change in Dosage (2)  DAVIS,  LAURA NP-C 04/25/2015, 4:28 PM  I reviewed chart and agreed with the findings and treatment Plan.  Kathryne Sharper, MD

## 2015-04-25 NOTE — [facility] Group Notes (Signed)
[facility] LCSW Group Therapy  04/25/2015 1:15 PM  Type of Therapy:  Group Therapy  Participation Level:  Active  Participation Quality:  Appropriate, Attentive and Sharing  Affect:  Depressed  Cognitive:  Appropriate  Insight:  Developing/Improving  Engagement in Therapy:  Engaged  Modes of Intervention:  Discussion, Exploration, Rapport Building, Socialization and Support  Summary of Progress/Problems: The main focus of today's process group was to learn how to use a decisional balance exercise to move forward in the Stages of Change, which were described and discussed. Motivational Interviewing and the whiteboard were utilized to help patients explore in depth the perceived benefits and costs of unhealthy coping techniques, as well as the benefits and costs of replacing that with a healthy coping skills.Nathaniel King was unable to identify anything he is looking forward to this year during group warm up exercise. Patient shared identification of over sleeping and negative self talk are his self sabotaging behaviors. He is motivated to making plans to go to the movies 3 monthly and connect more with others by making a positive daily telephone call upon discharge. [name] 04/25/2015 3:21 PM

## 2015-04-26 MED ORDER — ARIPIPRAZOLE 2 MG PO TABS
2.0000 mg | ORAL_TABLET | Freq: Every day | ORAL | Status: DC
  Administered 2015-04-27 – 2015-04-28 (×2): 2 mg via ORAL
  Filled 2015-04-26: qty 3
  Filled 2015-04-26 (×3): qty 1
  Filled 2015-04-26: qty 3

## 2015-04-26 MED ORDER — TRAZODONE HCL 50 MG PO TABS
50.0000 mg | ORAL_TABLET | Freq: Every evening | ORAL | Status: DC | PRN
Start: 2015-04-26 — End: 2015-04-28
  Filled 2015-04-26: qty 3
  Filled 2015-04-26: qty 1

## 2015-04-26 NOTE — Progress Notes (Signed)
Patient ID: Nathaniel Panda., male   DOB: [birth year], 19 y.o.   MRN: [id number] [facility] MD Progress Note  04/26/2015 3:19 PM Nathaniel Panda.  MRN:  [id number] Subjective:   Patient states "I really struggle with feeling worthless. Like there is nothing good about me. I feel a bit more depressed today. My mother visited me and she often makes me question myself. Like I am undecided in my major and that makes me feel lost. But I really have a passion for Spanish. But she said well what can you do with that. I think I can. I can get the bigger picture but get distracted about getting through all the steps. I wasn't doing well in school before. I see my Dad just existing at his job and I think how I do not want to be like that. I am open to working with a therapist."   Objective:  Patient is seen and chart is reviewed. He is rating his depression at a six. Reports it increased some after his mother came to visit last night. Patient continues to ruminate over his depressive symptoms and admits to feeling stuck in his negative thinking patterns. He has a hard time stating positive qualities about himself. Patient feels overwhelmed with declaring a major but also feels unmotivated with his current classes. Nathaniel King appears depressed throughout assessment but appeared receptive to hearing suggestions from Clinical research associate. Patient feels that working with a therapist may help him focus his attention and improve his ability to follow through. He reported feeling very groggy this morning and "foggy headed." Have changed his Abilify 2 mg daily to take in the morning. Patient is compliant with his medication regimen.   Principal Problem: Depression, major, recurrent, moderate Diagnosis:   Patient Active Problem List   Diagnosis Date Noted  . Major depression, single episode [F32.9] 04/24/2015  . Depression, major, recurrent, moderate [F33.1] 03/10/2015    Class: Acute  . Generalized anxiety disorder [F41.1] 03/04/2015  .  Depression [F32.9] 03/04/2015   Total Time spent with patient: 20 minutes  Past Medical History:  Past Medical History  Diagnosis Date  . Anxiety   . Depression     Past Surgical History  Procedure Laterality Date  . Foot fusion    . Wisdom tooth extraction     Family History:  Family History  Problem Relation Age of Onset  . Alcohol abuse Maternal Aunt   . Alcohol abuse Maternal Grandfather   . Hypertension Mother    Social History:  History  Alcohol Use No     History  Drug Use  . Yes  . Special: Marijuana    Comment: 3-4 x a year    History   Social History  . Marital Status: Single    Spouse Name: N/A  . Number of Children: N/A  . Years of Education: N/A   Social History Main Topics  . Smoking status: Former Smoker -- 0.25 packs/day    Types: Cigarettes    Quit date: 12/25/2014  . Smokeless tobacco: Never Used  . Alcohol Use: No  . Drug Use: Yes    Special: Marijuana     Comment: 3-4 x a year  . Sexual Activity: Yes    Birth Control/ Protection: Condom   Other Topics Concern  . None   Social History Narrative   Additional History:    Sleep: Fair  Appetite:  Fair   Assessment: MDD  Musculoskeletal: Strength & Muscle Tone: within normal limits Gait &  Station: normal Patient leans: N/A   Psychiatric Specialty Exam: Physical Exam  Review of Systems  Constitutional: Negative.   HENT: Negative.   Eyes: Negative.   Respiratory: Negative.   Cardiovascular: Negative.   Gastrointestinal: Positive for nausea (Reports early today but has improved ).  Genitourinary: Negative.   Musculoskeletal: Negative.   Skin: Negative.   Neurological: Negative.   Endo/Heme/Allergies: Negative.   Psychiatric/Behavioral: Positive for depression and suicidal ideas. Negative for hallucinations, memory loss and substance abuse. The patient is nervous/anxious and has insomnia.     Blood pressure 103/67, pulse 123, temperature 97.9 F (36.6 C), temperature  source Oral, resp. rate 16, height 5' 5.5" (1.664 m), weight 66.679 kg (147 lb).Body mass index is 24.08 kg/(m^2).  General Appearance: Casual  Eye Contact::  Fair  Speech:  Clear and Coherent  Volume:  Normal  Mood:  Dysphoric  Affect:  Constricted  Thought Process:  Coherent and Goal Directed  Orientation:  Full (Time, Place, and Person)  Thought Content:  Symptoms, worries, events  Suicidal Thoughts:  Yes.  without intent/plan  Homicidal Thoughts:  No  Memory:  Immediate;   Good Recent;   Fair Remote;   Fair  Judgement:  Fair  Insight:  Present  Psychomotor Activity:  Normal  Concentration:  Fair  Recall:  [name]:Good  Language: Good  Akathisia:  No  Handed:  Right  AIMS (if indicated):     Assets:  Communication Skills Desire for Improvement Leisure Time Physical Health Talents/Skills Vocational/Educational  ADL's:  Intact  Cognition: WNL  Sleep:  Number of Hours: 4     Current Medications: Current Facility-Administered Medications  Medication Dose Route Frequency Provider Last Rate Last Dose  . acetaminophen (TYLENOL) tablet 650 mg  650 mg Oral Q6H PRN Worthy Flank, NP      . alum & mag hydroxide-simeth (MAALOX/MYLANTA) 200-200-20 MG/5ML suspension 30 mL  30 mL Oral Q4H PRN Worthy Flank, NP      . ARIPiprazole (ABILIFY) tablet 2 mg  2 mg Oral QHS Sanjuana Kava, NP   2 mg at 04/25/15 2105  . buPROPion (WELLBUTRIN XL) 24 hr tablet 300 mg  300 mg Oral BH-q7a Rachael Fee, MD   300 mg at 04/26/15 1610  . hydrOXYzine (ATARAX/VISTARIL) tablet 25 mg  25 mg Oral Q6H PRN Worthy Flank, NP   25 mg at 04/25/15 2105  . loratadine (CLARITIN) tablet 10 mg  10 mg Oral Daily Worthy Flank, NP   10 mg at 04/26/15 0912  . magnesium hydroxide (MILK OF MAGNESIA) suspension 30 mL  30 mL Oral Daily PRN Worthy Flank, NP        Lab Results:  No results found for this or any previous visit (from the past 48 hour(s)).  Physical Findings: AIMS: Facial and  Oral Movements Muscles of Facial Expression: None, normal Lips and Perioral Area: None, normal Jaw: None, normal Tongue: None, normal,Extremity Movements Upper (arms, wrists, hands, fingers): None, normal Lower (legs, knees, ankles, toes): None, normal, Trunk Movements Neck, shoulders, hips: None, normal, Overall Severity Severity of abnormal movements (highest score from questions above): None, normal Incapacitation due to abnormal movements: None, normal Patient's awareness of abnormal movements (rate only patient's report): No Awareness, Dental Status Current problems with teeth and/or dentures?: No Does patient usually wear dentures?: No  CIWA:  CIWA-Ar Total: 1 COWS:  COWS Total Score: 2  Treatment Plan Summary: Daily contact with patient to assess and  evaluate symptoms and progress in treatment and Medication management  Continue crisis management and stabilization.  Medication management:  Continue Wellbutrin XL 150 mg for depression, Abilify 2 mg in the morning as an adjunct to Wellbutrin for depression, Hydroxyzine 25 mg prn for anxiety.  Encouraged patient to attend groups and participate in group counseling sessions and activities.  Discharge plan in progress.  Continue current treatment plan.  Address health issues: EKG done WNL. Vitals reviewed and stable.  Will work with CBT to challenge the distortions  Medical Decision Making:  Established Problem, Stable/Improving (1), Review of Psycho-Social Stressors (1), Review or order clinical lab tests (1), Review of Medication Regimen & Side Effects (2) and Review of New Medication or Change in Dosage (2)  DAVIS, LAURA NP-C 04/26/2015, 3:19 PM  I reviewed chart and agreed with the findings and treatment Plan.  Kathryne Sharper, MD

## 2015-04-26 NOTE — [facility] Group Notes (Signed)
[facility] Group Notes:  (Clinical Social Work)  04/26/2015  1:15-2:15PM  Summary of Progress/Problems:   The main focus of today's process group was to   1)  discuss the importance of adding supports  2)  define health supports versus unhealthy supports  3)  identify the patient's current unhealthy supports and plan how to handle them  4)  Identify the patient's current healthy supports and plan what to add.  An emphasis was placed on using counselor, doctor, therapy groups, 12-step groups, and problem-specific support groups to expand supports.    The patient was late to group, only arrived with about 15 minutes left, had a flat affect and participated minimally and with seeming reluctance.  He also seemed to be stuck in negative thinking, was not agreeable to consideration of a hobby even though many were mentioned around the room and he stated all he does for fun is watch TV or movies.  He repeatedly said "I already do that" when offered suggestions.  Type of Therapy:  Process Group with Motivational Interviewing  Participation Level:  Minimal  Participation Quality:  Resistant  Affect:  Depressed and Flat  Cognitive:  Alert  Insight:  Defensive and Limited  Engagement in Therapy:  Limited  Modes of Intervention:   Education, Support and Processing, Activity  Ambrose Mantle, LCSW 04/26/2015

## 2015-04-26 NOTE — [facility] Group Notes (Addendum)
[facility] Group Notes:  (Nursing/MHT/Case Management/Adjunct)  Date:  04/26/2015  Time:  0930  Type of Therapy:  Nurse Education  Participation Level:  Active  Participation Quality:  Appropriate  Affect:  Appropriate  Cognitive:  Alert and Appropriate  Insight:  Appropriate  Engagement in Group:  Engaged  Modes of Intervention:  Education  Summary of Progress/Problems:Patient attended group and was actively engaged. He rates his depression  6/10, feelings of hopelessness 7/10 and anxiety 2/10 with 10 being the worst.  Patient reports he would like to finish up his discharge plan.  Larry Sierras P 04/26/2015, 10:23 AM

## 2015-04-26 NOTE — Progress Notes (Signed)
D- Patient alert and oriented.  He is quiet and is in a depressed mood.  Denies SI, HI, AVH, and pain. Patient is observed in the milieu interacting with others.  Patient has c/o of being "too tired".  Patient states that he is still tired from the sleep medication that he received last night.  Patient refused to go to lunch and decided to stay in the Dayroom.  On patient self-inventory sheet, patient reported his depression a "6", feelings of hopelessness "7", and anxiety a "2" with 10 being the worst. A- Scheduled medications administered to patient, per MD orders. Support and encouragement provided.  Routine safety checks conducted every 15 minutes.  Patient informed to notify staff with problems or concerns. R- No adverse drug reactions noted. Patient contracts for safety at this time. Patient compliant with medications and treatment plan. Patient receptive, calm, and cooperative. Patient interacts well with others on the unit.  Patient remains safe at this time.

## 2015-04-27 NOTE — [facility] Suicide Risk Assessment (Signed)
[facility] INPATIENT:  Family/Significant Other Suicide Prevention Education  Suicide Prevention Education:  Patient Refusal for Family/Significant Other Suicide Prevention Education: The patient Nathaniel King. has refused to provide written consent for family/significant other to be provided Family/Significant Other Suicide Prevention Education during admission and/or prior to discharge.  Physician notified. SPE reviewed with patient and brochure provided. Patient encouraged to return to hospital if having suicidal thoughts, patient verbalized his/her understanding and has no further questions at this time.   Elaina Hoops 04/27/2015, 3:36 PM

## 2015-04-27 NOTE — Progress Notes (Signed)
D- Patient alert and oriented.  Patient appears depressed. Denies SI, HI, AVH, and pain.  Patient is visible in the milieu with minimal interaction with others.  He responds only when spoken to.  He states that he misses his sister and just wants to leave.  Patient states that he can contract for safety upon discharge with continue follow-up with outpatient services.  Patient reports poor sleep and concentration. He rates his depression a "4", feelings of hopelessness "5", and anxiety a "2" with 10 being the worst.  His goal is to continue to work on plans for discharge.  No complaints. A- Scheduled medications administered to patient, per MD orders. Support and encouragement provided.  Routine safety checks conducted every 15 minutes.  Patient informed to notify staff with problems or concerns. R- No adverse drug reactions noted. Patient contracts for safety at this time. Patient compliant with medications and treatment plan. Patient is quiet and reserved.  Patient remains safe at this time.

## 2015-04-27 NOTE — Progress Notes (Signed)
Recreation Therapy Notes  Date: 08.01.16 Time: 9:30 am Location: [address] Group Room  Group Topic: Stress Management  Goal Area(s) Addresses:  Patient will verbalize importance of using healthy stress management.  Patient will identify positive emotions associated with healthy stress management.   Intervention: Stress Management  Activity :  Guided Training and development officer.  LRT introduced and educated patients on the stress management technique of guided imagery script.  A script was read and patients were asked to follow a long as the LRT read the script a loud to engaged in the technique of guided imagery.  Education:  Stress Management, Discharge Planning.   Education Outcome: Acknowledges edcuation/In group clarification offered/Needs additional education  Clinical Observations/Feedback: Patient did not attend group.   Caroll Rancher, LRT/CTRS         Lillia Abed, Ruthe Roemer A 04/27/2015 1:21 PM

## 2015-04-27 NOTE — Progress Notes (Signed)
[facility] Group Notes:  (Nursing/MHT/Case Management/Adjunct)  Date:  04/27/2015  Time:  10:12 PM  Type of Therapy:  Psychoeducational Skills  Participation Level:  Active  Participation Quality:  Appropriate  Affect:  Appropriate  Cognitive:  Appropriate  Insight:  Good  Engagement in Group:  Engaged  Modes of Intervention:  Discussion  Summary of Progress/Problems: Tonight in wrap up group Jasaiah said that his day was an 8 he was able to reconnect with his mother and he feels as though she is more aware of his diagnosis and is more understanding. He also stated that he is now focused on getting back in school and possibly majoring in Nursing.    Madaline Savage 04/27/2015, 10:12 PM

## 2015-04-27 NOTE — Progress Notes (Signed)
[facility] MD Progress Note  04/27/2015 6:58 PM Nathaniel King.  MRN:  [id number] Subjective:  Nathaniel King admits he is still having a hard time interacting with his mother. States she puts a lot of pressure on him to make decisions to pursue a career that will give him some income right away. She does not seem to accept his BF. He is Nathaniel King and she does not seem to be that open minded. He himself is not sure about the relationship with his BF. He has not come here has not contacted him. Nathaniel King is aware of all the issues he has to deal with more so issues of self esteem self condemnation etc Principal Problem: Depression, major, recurrent, moderate Diagnosis:   Patient Active Problem List   Diagnosis Date Noted  . Major depression, single episode [F32.9] 04/24/2015  . Depression, major, recurrent, moderate [F33.1] 03/10/2015    Class: Acute  . Generalized anxiety disorder [F41.1] 03/04/2015  . Depression [F32.9] 03/04/2015   Total Time spent with patient: 30 minutes   Past Medical History:  Past Medical History  Diagnosis Date  . Anxiety   . Depression     Past Surgical History  Procedure Laterality Date  . Foot fusion    . Wisdom tooth extraction     Family History:  Family History  Problem Relation Age of Onset  . Alcohol abuse Maternal Aunt   . Alcohol abuse Maternal Grandfather   . Hypertension Mother    Social History:  History  Alcohol Use No     History  Drug Use  . Yes  . Special: Marijuana    Comment: 3-4 x a year    History   Social History  . Marital Status: Single    Spouse Name: N/A  . Number of Children: N/A  . Years of Education: N/A   Social History Main Topics  . Smoking status: Former Smoker -- 0.25 packs/day    Types: Cigarettes    Quit date: 12/25/2014  . Smokeless tobacco: Never Used  . Alcohol Use: No  . Drug Use: Yes    Special: Marijuana     Comment: 3-4 x a year  . Sexual Activity: Yes    Birth Control/ Protection: Condom   Other  Topics Concern  . None   Social History Narrative   Additional History:    Sleep: Fair  Appetite:  Fair   Assessment:   Musculoskeletal: Strength & Muscle Tone: within normal limits Gait & Station: normal Patient leans: normal   Psychiatric Specialty Exam: Physical Exam  Review of Systems  Constitutional: Negative.   HENT: Negative.   Eyes: Negative.   Respiratory: Negative.   Cardiovascular: Negative.   Gastrointestinal: Negative.   Genitourinary: Negative.   Musculoskeletal: Negative.   Skin: Negative.   Neurological: Negative.   Endo/Heme/Allergies: Negative.   Psychiatric/Behavioral: Positive for depression. The patient is nervous/anxious and has insomnia.     Blood pressure 124/87, pulse 90, temperature 97.8 F (36.6 C), temperature source Oral, resp. rate 16, height 5' 5.5" (1.664 m), weight 66.679 kg (147 lb).Body mass index is 24.08 kg/(m^2).  General Appearance: Fairly Groomed  Patent attorney::  Fair  Speech:  Clear and Coherent  Volume:  Normal  Mood:  Anxious and worried  Affect:  Appropriate  Thought Process:  Coherent and Goal Directed  Orientation:  Full (Time, Place, and Person)  Thought Content:  symptoms events worries concerns  Suicidal Thoughts:  No  Homicidal Thoughts:  No  Memory:  Immediate;   Fair Recent;   Fair Remote;   Fair  Judgement:  Fair  Insight:  Present  Psychomotor Activity:  Normal  Concentration:  Fair  Recall:  Nathaniel King:Fair  Language: Fair  Akathisia:  No  Handed:  Right  AIMS (if indicated):     Assets:  Desire for Improvement  ADL's:  Intact  Cognition: WNL  Sleep:  Number of Hours: 4     Current Medications: Current Facility-Administered Medications  Medication Dose Route Frequency Provider Last Rate Last Dose  . acetaminophen (TYLENOL) tablet 650 mg  650 mg Oral Q6H PRN Worthy Flank, NP      . alum & mag hydroxide-simeth (MAALOX/MYLANTA) 200-200-20 MG/5ML suspension 30 mL  30 mL Oral Q4H  PRN Worthy Flank, NP      . ARIPiprazole (ABILIFY) tablet 2 mg  2 mg Oral Daily Thermon Leyland, NP   2 mg at 04/27/15 9562  . buPROPion (WELLBUTRIN XL) 24 hr tablet 300 mg  300 mg Oral BH-q7a Rachael Fee, MD   300 mg at 04/27/15 1308  . hydrOXYzine (ATARAX/VISTARIL) tablet 25 mg  25 mg Oral Q6H PRN Worthy Flank, NP   25 mg at 04/25/15 2105  . loratadine (CLARITIN) tablet 10 mg  10 mg Oral Daily Worthy Flank, NP   10 mg at 04/27/15 0807  . magnesium hydroxide (MILK OF MAGNESIA) suspension 30 mL  30 mL Oral Daily PRN Worthy Flank, NP      . traZODone (DESYREL) tablet 50 mg  50 mg Oral QHS PRN Thermon Leyland, NP        Lab Results: No results found for this or any previous visit (from the past 48 hour(s)).  Physical Findings: AIMS: Facial and Oral Movements Muscles of Facial Expression: None, normal Lips and Perioral Area: None, normal Jaw: None, normal Tongue: None, normal,Extremity Movements Upper (arms, wrists, hands, fingers): None, normal Lower (legs, knees, ankles, toes): None, normal, Trunk Movements Neck, shoulders, hips: None, normal, Overall Severity Severity of abnormal movements (highest score from questions above): None, normal Incapacitation due to abnormal movements: None, normal Patient's awareness of abnormal movements (rate only patient's report): No Awareness, Dental Status Current problems with teeth and/or dentures?: No Does patient usually wear dentures?: No  CIWA:  CIWA-Ar Total: 1 COWS:  COWS Total Score: 2  Treatment Plan Summary: Daily contact with patient to assess and evaluate symptoms and progress in treatment and Medication management Supportive approach/coping skills Depression; continue the Wellbutrin Xl 300 mg and reassess Continue to augment with Abilify 2 mg daily Use CBT/mindfulness and help challenge his cognitive errors; should statements, catastrophic thinking, personalization and help replace with more appropriate ways of perceiving  the events   Medical Decision Making:  Review of Psycho-Social Stressors (1), Review of Medication Regimen & Side Effects (2) and Review of New Medication or Change in Dosage (2)     Nathaniel King A 04/27/2015, 6:58 PM

## 2015-04-27 NOTE — Progress Notes (Signed)
D: Patient denies SI/HI/AVH, and pain. Patient is alert and oriented x 4. Patient stated he is depressed 5/10. Patient was polite. Patient stated he did not want any sleep medications that he received the night before, because it made him so tired this morning. Upon looking at patients current orders Abilify is now scheduled for 0800. Patient made aware.  A: Staff to monitor Q 15 mins for safety. Encouragement and support offered. Scheduled medications administered per orders. R: Patient remains safe on the unit. Patient attended group tonight. Patient visible on hte unit and interacting with peers. Patient taking administered medications.

## 2015-04-27 NOTE — [facility] Group Notes (Signed)
[facility] LCSW Group Therapy  04/27/2015 1:15pm  Type of Therapy:  Group Therapy vercoming Obstacles  Participation Level:  Active  Participation Quality:  Appropriate   Affect:  Appropriate  Cognitive:  Appropriate and Oriented  Insight:  Developing/Improving and Improving  Engagement in Therapy:  Improving  Modes of Intervention:  Discussion, Exploration, Problem-solving and Support  Description of Group:   In this group patients will be encouraged to explore what they see as obstacles to their own wellness and recovery. They will be guided to discuss their thoughts, feelings, and behaviors related to these obstacles. The group will process together ways to cope with barriers, with attention given to specific choices patients can make. Each patient will be challenged to identify changes they are motivated to make in order to overcome their obstacles. This group will be process-oriented, with patients participating in exploration of their own experiences as well as giving and receiving support and challenge from other group members.  Summary of Patient Progress: Pt was more active in group today but remains largely reserved. Pt identified that going to the gym helps him to control his anger and deal with stress. He was appropriate when suggesting similar activities to a peer. Pt was receptive to feedback and remained engaged during group discussion.   Therapeutic Modalities:   Cognitive Behavioral Therapy Solution Focused Therapy Motivational Interviewing Relapse Prevention Therapy   Chad Cordial, LCSWA 04/27/2015 4:03 PM

## 2015-04-27 NOTE — [facility] Group Notes (Signed)
[facility] LCSW Aftercare Discharge Planning Group Note  04/27/2015 8:45 AM  Participation Quality: Alert, Appropriate and Oriented  Mood/Affect: Flat  Depression Rating: 3-4  Anxiety Rating: 3-4  Thoughts of Suicide: Pt denies SI/HI  Will you contract for safety? Yes  Current AVH: Pt denies  Plan for Discharge/Comments: Pt attended discharge planning group and actively participated in group. CSW discussed suicide prevention education with the group and encouraged them to discuss discharge planning and any relevant barriers. Pt reports improved mood, however still presents with flat affect and is reserved in group discussion. Pt is requesting referral to a new psychiatrist.  Transportation Means: Pt reports access to transportation  Supports: No supports mentioned at this time  Chad Cordial, LCSWA 04/27/2015 9:31 AM

## 2015-04-28 MED ORDER — BUPROPION HCL ER (XL) 300 MG PO TB24: 300.0000 mg | ORAL_TABLET | ORAL | Status: DC

## 2015-04-28 MED ORDER — TRAZODONE HCL 50 MG PO TABS: 50.0000 mg | ORAL_TABLET | Freq: Every evening | ORAL | Status: DC | PRN

## 2015-04-28 MED ORDER — ARIPIPRAZOLE 2 MG PO TABS: 2.0000 mg | ORAL_TABLET | Freq: Every day | ORAL | Status: DC

## 2015-04-28 NOTE — Progress Notes (Signed)
  [facility] Adult Case Management Discharge Plan :  Will you be returning to the same living situation after discharge:  Yes,  Patient plans to return home  At discharge, do you have transportation home?: Yes,  Patient reports access to transportation Do you have the ability to pay for your medications: Yes,  patient will be provided with prescriptions  Release of information consent forms completed and in the chart;  Patient's signature needed at discharge.  Patient to Follow up at: Follow-up Information    Follow up with [facility] On 04/30/2015.   Why:  at 11:00am for your initial evaluation with Olene Craven. Please arrive at 10:30am   Contact information:   [address] [place], [place] [address] Phone: [phone]      Follow up with [facility] On 05/07/2015.   Why:  at 1:00pm for medication management with Maralyn Sago.   Contact information:   [address] [place], [place] [address] Phone: [phone]      Patient denies SI/HI: Yes,  denies    Safety Planning and Suicide Prevention discussed: Yes,  with patient  Have you used any form of tobacco in the last 30 days? (Cigarettes, Smokeless Tobacco, Cigars, and/or Pipes): No  Has patient been referred to the Quitline?: Nonsmoker  Nathaniel King L 04/28/2015, 10:05 AM

## 2015-04-28 NOTE — Discharge Summary (Signed)
Physician Discharge Summary Note  Patient:  Nathaniel King. is an 19 y.o., male MRN:  [id number] DOB:  1995-12-27 Patient phone:  [phone] (home)  Patient address:   [address] [place] [address],  Total Time spent with patient: 30 minutes  Date of Admission:  04/24/2015 Date of Discharge: 04/28/15  Reason for Admission:  Depression with SI  Principal Problem: Depression, major, recurrent, moderate Discharge Diagnoses: Patient Active Problem List   Diagnosis Date Noted  . Major depression, single episode [F32.9] 04/24/2015  . Depression, major, recurrent, moderate [F33.1] 03/10/2015    Class: Acute  . Generalized anxiety disorder [F41.1] 03/04/2015  . Depression [F32.9] 03/04/2015    Musculoskeletal: Strength & Muscle Tone: within normal limits Gait & Station: normal Patient leans: N/A  Psychiatric Specialty Exam: Physical Exam  Psychiatric: He has a normal mood and affect. His speech is normal and behavior is normal. Judgment and thought content normal. Cognition and memory are normal.    Review of Systems  Constitutional: Negative.   HENT: Negative.   Eyes: Negative.   Respiratory: Negative.   Cardiovascular: Negative.   Gastrointestinal: Negative.   Genitourinary: Negative.   Musculoskeletal: Negative.   Skin: Negative.   Neurological: Negative.   Endo/Heme/Allergies: Negative.   Psychiatric/Behavioral: Positive for depression (Stabilized with treatments). Negative for suicidal ideas, hallucinations, memory loss and substance abuse. The patient is not nervous/anxious and does not have insomnia.     Blood pressure 124/87, pulse 90, temperature 97.8 F (36.6 C), temperature source Oral, resp. rate 16, height 5' 5.5" (1.664 m), weight 66.679 kg (147 lb).Body mass index is 24.08 kg/(m^2).  See Physician SRA     Have you used any form of tobacco in the last 30 days? (Cigarettes, Smokeless Tobacco, Cigars, and/or Pipes): No  Has this patient used  any form of tobacco in the last 30 days? (Cigarettes, Smokeless Tobacco, Cigars, and/or Pipes) No  Past Medical History:  Past Medical History  Diagnosis Date  . Anxiety   . Depression     Past Surgical History  Procedure Laterality Date  . Foot fusion    . Wisdom tooth extraction     Family History:  Family History  Problem Relation Age of Onset  . Alcohol abuse Maternal Aunt   . Alcohol abuse Maternal Grandfather   . Hypertension Mother    Social History:  History  Alcohol Use No     History  Drug Use  . Yes  . Special: Marijuana    Comment: 3-4 x a year    History   Social History  . Marital Status: Single    Spouse Name: N/A  . Number of Children: N/A  . Years of Education: N/A   Social History Main Topics  . Smoking status: Former Smoker -- 0.25 packs/day    Types: Cigarettes    Quit date: 12/25/2014  . Smokeless tobacco: Never Used  . Alcohol Use: No  . Drug Use: Yes    Special: Marijuana     Comment: 3-4 x a year  . Sexual Activity: Yes    Birth Control/ Protection: Condom   Other Topics Concern  . None   Social History Narrative   Risk to Self: Is patient at risk for suicide?: Yes What has been your use of drugs/alcohol within the last 12 months?:  (etoh- none sinmce April. THC- none since Feb 16) Risk to Others:   Prior Inpatient Therapy:   Prior Outpatient Therapy:    Level of  Care:  OP  Hospital Course:    Nathaniel King is a 19 year old caucasian male who was admitted to the [facility] from the [facility] long hospital with reports of worsening symptoms of depression & suicidal ideations without plans or intent. He reports, "My sister took me to the ED last night. It was just due to severe depression. My depression worsened 3 days ago without any trigger. I have been depressed since age 29. I don't know why I feel so depressed from a young age. I really think that I have bipolar disorder, although it has not been diagnosed. I'm not always feeling  depressed, it just that when I'm happy, I feel so happy that I will lose control of myself like spending a lot of money. I started depression treatment last June with medication regimen called Wellbutrin XL 150 mg. However, I have not noticed any changes or improvement in my mood. I sleep too much. I have not had a job since January of this year. I want to be able to have a job, make some money. When I get into my Major depressive mood, I feel lonely because I shot out of the rest of my people. I don't interact with anyone when I feel very depressed. The suicidal thoughts are more or less fleeting thoughts, no actual plans or intent. I have not attempted suicide. I'm receiving counseling sessions at the [facility] outpatient clinic with [facility]. I don't really know anyone in my family with mental illness, except my sister, she has dealt with anxiety & my mother can be unpredictable with her mood".         Nathaniel King. was admitted to the adult 400 unit. He was evaluated and his symptoms were identified. Medication management was discussed and initiated. His Wellbutrin Xl was increased to 300 mg daily for treatment of depression. Patient was started on Abilify 2 mg daily for augmentation to his treatment for depression. He was oriented to the unit and encouraged to participate in unit programming. Medical problems were identified and treated appropriately. Home medication was restarted as needed.        The patient was evaluated each day by a clinical provider to ascertain the patient's response to treatment.  Improvement was noted by the patient's report of decreasing symptoms, improved sleep and appetite, affect, medication tolerance, behavior, and participation in unit programming.  He was asked each day to complete a self inventory noting mood, mental status, pain, new symptoms, anxiety and concerns. Patient was receptive to CBT techniques that challenged some of his cognitive distortions. He identified his mother  as a major trigger to his depression because she questions many of his decisions. Patient talked about feeling worthless because he has not been able to declare a major in college. He reported that he was entertaining at least six options with one of them being [place]. Patient was receptive to working with a therapist after discharge to continue working on his identified problems.          He responded well to medication and being in a therapeutic and supportive environment. Positive and appropriate behavior was noted and the patient was motivated for recovery.  The patient worked closely with the treatment team and case manager to develop a discharge plan with appropriate goals. Coping skills, problem solving as well as relaxation therapies were also part of the unit programming.         By the day of discharge he was in much  improved condition than upon admission.  Symptoms were reported as significantly decreased or resolved completely. The patient denied SI/HI and voiced no AVH. He was motivated to continue taking medication with a goal of continued improvement in mental health.  Nathaniel King. was discharged home with a plan to follow up as noted below. The patient was provided with sample medications and prescriptions at time of discharge. He left [facility] in stable condition with all belongings returned to him.    Consults:  None  Significant Diagnostic Studies:  Chemistry panel, CBC, TSH, UDS negative  Discharge Vitals:   Blood pressure 124/87, pulse 90, temperature 97.8 F (36.6 C), temperature source Oral, resp. rate 16, height 5' 5.5" (1.664 m), weight 66.679 kg (147 lb). Body mass index is 24.08 kg/(m^2). Lab Results:   No results found for this or any previous visit (from the past 72 hour(s)).  Physical Findings: AIMS: Facial and Oral Movements Muscles of Facial Expression: None, normal Lips and Perioral Area: None, normal Jaw: None, normal Tongue: None, normal,Extremity  Movements Upper (arms, wrists, hands, fingers): None, normal Lower (legs, knees, ankles, toes): None, normal, Trunk Movements Neck, shoulders, hips: None, normal, Overall Severity Severity of abnormal movements (highest score from questions above): None, normal Incapacitation due to abnormal movements: None, normal Patient's awareness of abnormal movements (rate only patient's report): No Awareness, Dental Status Current problems with teeth and/or dentures?: No Does patient usually wear dentures?: No  CIWA:  CIWA-Ar Total: 1 COWS:  COWS Total Score: 2   See Psychiatric Specialty Exam and Suicide Risk Assessment completed by Attending Physician prior to discharge.  Discharge destination:  Home  Is patient on multiple antipsychotic therapies at discharge:  No   Has Patient had three or more failed trials of antipsychotic monotherapy by history:  No    Recommended Plan for Multiple Antipsychotic Therapies: NA     Medication List    TAKE these medications      Indication   ARIPiprazole 2 MG tablet  Commonly known as:  ABILIFY  Take 1 tablet (2 mg total) by mouth daily.   Indication:  Major Depressive Disorder, Adjunct to Wellbutrin     buPROPion 300 MG 24 hr tablet  Commonly known as:  WELLBUTRIN XL  Take 1 tablet (300 mg total) by mouth every morning.   Indication:  Major Depressive Disorder     cetirizine 10 MG tablet  Commonly known as:  ZYRTEC  Take 10 mg by mouth daily as needed for allergies.      traZODone 50 MG tablet  Commonly known as:  DESYREL  Take 1 tablet (50 mg total) by mouth at bedtime as needed for sleep.   Indication:  Trouble Sleeping       Follow-up Information    Follow up with [facility] On 04/30/2015.   Why:  at 11:00am for your initial evaluation with Olene Craven. Please arrive at 10:30am   Contact information:   [address] [place], [place] [address] Phone: [phone]      Follow up with [facility] On  05/07/2015.   Why:  at 1:00pm for medication management with Maralyn Sago.   Contact information:   [address] [place], [place] [address] Phone: [phone]      Follow-up recommendations:   Activity: as tolerated Diet: regular Follow up as above  Comments:   Take all your medications as prescribed by your mental healthcare provider.  Report any adverse effects and or reactions  from your medicines to your outpatient provider promptly.  Patient is instructed and cautioned to not engage in alcohol and or illegal drug use while on prescription medicines.  In the event of worsening symptoms, patient is instructed to call the crisis hotline, 911 and or go to the nearest ED for appropriate evaluation and treatment of symptoms.  Follow-up with your primary care provider for your other medical issues, concerns and or health care needs.   Total Discharge Time: Greater than 30 minutes  Signed: DAVIS, LAURA NP-C 04/28/2015, 9:10 AM  I personally assessed the patient and formulated the plan Madie Reno A. Dub Mikes, M.D.

## 2015-04-28 NOTE — Progress Notes (Signed)
D/C instructions/meds/follow-up appointments reviewed, pt verbalized understanding, pt's belongings returned to pt, samples given, denies SI/HI/AVH 

## 2015-04-28 NOTE — [facility] Suicide Risk Assessment (Signed)
[facility] Discharge Suicide Risk Assessment   Demographic Factors:  Male, Adolescent or young adult and Caucasian  Total Time spent with patient: 30 minutes  Musculoskeletal: Strength & Muscle Tone: within normal limits Gait & Station: normal Patient leans: normal  Psychiatric Specialty Exam: Physical Exam  ROS  Blood pressure 124/87, pulse 90, temperature 97.8 F (36.6 C), temperature source Oral, resp. rate 16, height 5' 5.5" (1.664 m), weight 66.679 kg (147 lb).Body mass index is 24.08 kg/(m^2).  General Appearance: Fairly Groomed  Patent attorney::  Fair  Speech:  Clear and Coherent409  Volume:  Normal  Mood:  Euthymic  Affect:  Appropriate  Thought Process:  Coherent and Goal Directed  Orientation:  Full (Time, Place, and Person)  Thought Content:  plans as he moves on  Suicidal Thoughts:  No  Homicidal Thoughts:  No  Memory:  Immediate;   Fair Recent;   Fair Remote;   Fair  Judgement:  Fair  Insight:  Present  Psychomotor Activity:  Normal  Concentration:  Fair  Recall:  [name]:Fair  Language: Fair  Akathisia:  No  Handed:  Right  AIMS (if indicated):     Assets:  Desire for Improvement Housing Social Support  Sleep:  Number of Hours: 4  Cognition: WNL  ADL's:  Intact   Have you used any form of tobacco in the last 30 days? (Cigarettes, Smokeless Tobacco, Cigars, and/or Pipes): No  Has this patient used any form of tobacco in the last 30 days? (Cigarettes, Smokeless Tobacco, Cigars, and/or Pipes) No  Mental Status Per Nursing Assessment::   On Admission:  NA  Current Mental Status by Physician: In full contact with reality. There are no active SI plans or intent. He states he is committed to make things work for himself. He is not going back to school right now. He plans to get a job and re enroll in January. He is going to continue to work on his coping skills, identify and change his way of perceiving and analyzing things   Loss  Factors: NA  Historical Factors: NA  Risk Reduction Factors:   Sense of responsibility to family, Living with another person, especially a relative and Positive social support  Continued Clinical Symptoms:  Depression:   Impulsivity  Cognitive Features That Contribute To Risk:  None    Suicide Risk:  Minimal: No identifiable suicidal ideation.  Patients presenting with no risk factors but with morbid ruminations; may be classified as minimal risk based on the severity of the depressive symptoms  Principal Problem: Depression, major, recurrent, moderate Discharge Diagnoses:  Patient Active Problem List   Diagnosis Date Noted  . Major depression, single episode [F32.9] 04/24/2015  . Depression, major, recurrent, moderate [F33.1] 03/10/2015    Class: Acute  . Generalized anxiety disorder [F41.1] 03/04/2015  . Depression [F32.9] 03/04/2015    Follow-up Information    Follow up with [facility] On 04/30/2015.   Why:  at 11:00am for your initial evaluation with Olene Craven. Please arrive at 10:30am   Contact information:   [address] [place], [place] [address] Phone: [phone]      Follow up with [facility] On 05/07/2015.   Why:  at 1:00pm for medication management with Maralyn Sago.   Contact information:   [address] [place], [place] [address] Phone: [phone]      Plan Of Care/Follow-up recommendations:  Activity:  as tolerated Diet:  regular Follow up as above Is patient  on multiple antipsychotic therapies at discharge:  No   Has Patient had three or more failed trials of antipsychotic monotherapy by history:  No  Recommended Plan for Multiple Antipsychotic Therapies: NA    Lovey Crupi A 04/28/2015, 12:38 PM

## 2015-04-28 NOTE — Progress Notes (Signed)
D: Pt denies SI/HI/AVH. Pt is pleasant and cooperative. Pt forwards little to writer and actually avoids during the evening hours.   A: Pt was offered support and encouragement. Pt was given scheduled medications. Pt was encourage to attend groups. Q 15 minute checks were done for safety.   R:Pt attends groups and interacts well with peers and staff. Pt is taking medication. Pt has no complaints at this time.Pt receptive to treatment and safety maintained on unit.

## 2015-04-28 NOTE — Tx Team (Addendum)
Interdisciplinary Treatment Plan Update (Adult) Date: 04/28/2015   Time Reviewed: 9:30 AM  Progress in Treatment: Attending groups: Yes Participating in groups: Yes Taking medication as prescribed: Yes Tolerating medication: Yes Family/Significant other contact made: No, patient has declined family contact Patient understands diagnosis: Yes Discussing patient identified problems/goals with staff: Yes Medical problems stabilized or resolved: Yes Denies suicidal/homicidal ideation: Yes Issues/concerns per patient self-inventory: Yes Other:  New problem(s) identified: N/A  Discharge Plan or Barriers:   04/28/2015: Patient plans to discharge home to follow up with outpatient providers.  Reason for Continuation of Hospitalization:  Depression Anxiety Medication Stabilization   Comments: N/A  Estimated length of stay: Discharge anticipated for today 04/28/15.   Patient is a 19 year old male admitted for increased depression. Patient will benefit from crisis stabilization, medication evaluation, group therapy, and psycho education in addition to case management for discharge planning. Patient and CSW reviewed pt's identified goals and treatment plan. Pt verbalized understanding and agreed to treatment plan.     Review of initial/current patient goals per problem list:  1. Goal(s): Patient will participate in aftercare plan   Met: Yes   Target date: 3-5 days post admission date   As evidenced by: Patient will participate within aftercare plan AEB aftercare provider and housing plan at discharge being identified.  04/28/2015: Patient plans to return home to follow up with outpatient services.   2. Goal (s): Patient will exhibit decreased depressive symptoms and suicidal ideations.   Met: Yes   Target date: 3-5 days post admission date   As evidenced by: Patient will utilize self rating of depression at 3 or below and demonstrate decreased signs of depression or be  deemed stable for discharge by MD.  04/28/2015: Adequate for discharge per MD.   3. Goal(s): Patient will demonstrate decreased signs and symptoms of anxiety.   Met: Yes   Target date: 3-5 days post admission date   As evidenced by: Patient will utilize self rating of anxiety at 3 or below and demonstrated decreased signs of anxiety, or be deemed stable for discharge by MD  04/28/2015: Adequate for discharge per MD.    Attendees: Patient:    Family:    Physician:  Dr. Sabra Heck 04/28/2015 9:30 AM  Nursing:  Markham Jordan, Darrol Angel ,RN 04/28/2015 9:30 AM  Clinical Social Worker: Tilden Fossa,  [place]on 04/28/2015 9:30 AM  Other: Peri Maris, Rose  04/28/2015 9:30 AM  Other: Lucinda Dell, Beverly Sessions Liaison 04/28/2015 9:30 AM  Other: Lars Pinks, Case Manager 04/28/2015 9:30 AM  Other: Ave Filter , NP 04/28/2015 9:30 AM  Other:    Other:      Scribe for Treatment Team:  Tilden Fossa, MSW, [place]

## 2015-04-28 NOTE — Plan of Care (Signed)
Problem: Alteration in mood Goal: LTG-Patient reports reduction in suicidal thoughts (Patient reports reduction in suicidal thoughts and is able to verbalize a safety plan for whenever patient is feeling suicidal)  Outcome: Progressing Pt denies SI at this time     

## 2015-04-28 NOTE — [facility] Group Notes (Signed)
Adult Psychoeducational Group Note  Date:  04/28/2015 Time:  0900am  Group Topic/Focus:  Goals Group:   The focus of this group is to help patients establish daily goals to achieve during treatment and discuss how the patient can incorporate goal setting into their daily lives to aide in recovery. Orientation:   The focus of this group is to educate the patient on the purpose and policies of crisis stabilization and provide a format to answer questions about their admission.  The group details unit policies and expectations of patients while admitted.  Participation Level:  Active  Participation Quality:  Appropriate and Attentive  Affect:  Appropriate  Cognitive:  Alert and Appropriate  Insight: Appropriate and Good  Engagement in Group:  Engaged  Modes of Intervention:  Discussion, Education, Orientation and Support  Lauris Poag 04/28/2015, 9:27 AM

## 2015-05-07 ENCOUNTER — Ambulatory Visit (HOSPITAL_COMMUNITY): Payer: Self-pay | Admitting: Medical

## 2019-10-28 ENCOUNTER — Encounter (HOSPITAL_COMMUNITY): Payer: Self-pay | Admitting: Emergency Medicine

## 2019-10-28 ENCOUNTER — Emergency Department (HOSPITAL_COMMUNITY)
Admission: EM | Admit: 2019-10-28 | Discharge: 2019-10-28 | Disposition: A | Discharge status: Home / Self Care | Payer: 59 | Attending: Emergency Medicine | Admitting: Emergency Medicine

## 2019-10-28 DIAGNOSIS — Y999 Unspecified external cause status: Secondary | ICD-10-CM | POA: Insufficient documentation

## 2019-10-28 DIAGNOSIS — Z23 Encounter for immunization: Secondary | ICD-10-CM | POA: Diagnosis not present

## 2019-10-28 DIAGNOSIS — Y9389 Activity, other specified: Secondary | ICD-10-CM | POA: Insufficient documentation

## 2019-10-28 DIAGNOSIS — S61213A Laceration without foreign body of left middle finger without damage to nail, initial encounter: Secondary | ICD-10-CM | POA: Insufficient documentation

## 2019-10-28 DIAGNOSIS — W268XXA Contact with other sharp object(s), not elsewhere classified, initial encounter: Secondary | ICD-10-CM | POA: Diagnosis not present

## 2019-10-28 DIAGNOSIS — Y929 Unspecified place or not applicable: Secondary | ICD-10-CM | POA: Insufficient documentation

## 2019-10-28 MED ORDER — TETANUS-DIPHTH-ACELL PERTUSSIS 5-2.5-18.5 LF-MCG/0.5 IM SUSP
0.5000 mL | Freq: Once | INTRAMUSCULAR | Status: AC
  Administered 2019-10-28: 0.5 mL via INTRAMUSCULAR
  Filled 2019-10-28: qty 0.5

## 2019-10-28 MED ORDER — CEPHALEXIN 500 MG PO CAPS: 500.0000 mg | ORAL_CAPSULE | Freq: Four times a day (QID) | ORAL | 0 refills | Status: AC

## 2019-10-28 NOTE — ED Provider Notes (Signed)
[facility] EMERGENCY DEPARTMENT Provider Note   CSN: [id number] Arrival date & time: 10/28/19  1539     History Chief Complaint  Patient presents with   Finger Injury    Nathaniel King. is a 24 y.o. male presents today for evaluation of a laceration to the tip of his left middle finger.  He was taking out the trash yesterday at 4 PM when he cut his finger on a vegetable can lid.  He reports a minimal amount of bleeding initially, he went and washed the finger thoroughly in a sink and applied antibiotic ointment and bandaged the finger.  He reports a minimal sharp pain right after he cut his finger, nonradiating worse with palpation improved with rest.  He reports that pain had resolved shortly after the laceration occurred.  Patient reports he felt a slight tingling sensation around the cut when he woke up this morning and feels that this tingling sensation is also in his left elbow, he then Googled his symptoms and became concerned for possible tetanus.  He reports increased anxiety since that time and feels as if both of his hands are not tingling and he is feeling jittery.  He denies any fever/chills, weakness, swelling/color change, muscle spasms, nausea/vomiting, trismus or any additional concerns.  HPI     Past Medical History:  Diagnosis Date   Anxiety    Depression     Patient Active Problem List   Diagnosis Date Noted   Major depression, single episode 04/24/2015   Depression, major, recurrent, moderate ([place]) 03/10/2015    Class: Acute   Generalized anxiety disorder 03/04/2015   Depression 03/04/2015    Past Surgical History:  Procedure Laterality Date   FOOT FUSION     WISDOM TOOTH EXTRACTION         Family History  Problem Relation Age of Onset   Hypertension Mother    Alcohol abuse Maternal Aunt    Alcohol abuse Maternal Grandfather     Social History   Tobacco Use   Smoking status: Former Smoker    Packs/day: 0.25     Types: Cigarettes    Quit date: 12/25/2014    Years since quitting: 4.8   Smokeless tobacco: Never Used  Substance Use Topics   Alcohol use: No   Drug use: Yes    Types: Marijuana    Comment: 3-4 x a year    Home Medications Prior to Admission medications   Medication Sig Start Date End Date Taking? Authorizing Provider  ARIPiprazole (ABILIFY) 2 MG tablet Take 1 tablet (2 mg total) by mouth daily. 04/28/15   Niel Hummer, NP  buPROPion (WELLBUTRIN XL) 300 MG 24 hr tablet Take 1 tablet (300 mg total) by mouth every morning. 04/28/15   Niel Hummer, NP  cephALEXin (KEFLEX) 500 MG capsule Take 1 capsule (500 mg total) by mouth 4 (four) times daily for 5 days. 10/28/19 11/02/19  Nuala Alpha A, PA-C  cetirizine (ZYRTEC) 10 MG tablet Take 10 mg by mouth daily as needed for allergies.    [provider]  traZODone (DESYREL) 50 MG tablet Take 1 tablet (50 mg total) by mouth at bedtime as needed for sleep. 04/28/15   Niel Hummer, NP    Allergies    Acetaminophen  Review of Systems   Review of Systems Ten systems are reviewed and are negative for acute change except as noted in the HPI  Physical Exam Updated Vital Signs BP (!) 149/87 (BP  Location: Right Arm)    Pulse 100    Temp 98.8 F (37.1 C) (Oral)    Resp 16    SpO2 100%   Physical Exam Constitutional:      General: He is not in acute distress.    Appearance: Normal appearance. He is well-developed. He is not ill-appearing or diaphoretic.  HENT:     Head: Normocephalic and atraumatic.     Right Ear: External ear normal.     Left Ear: External ear normal.     Nose: Nose normal.  Eyes:     General: Vision grossly intact. Gaze aligned appropriately.     Pupils: Pupils are equal, round, and reactive to light.  Neck:     Trachea: Trachea and phonation normal. No tracheal deviation.  Cardiovascular:     Rate and Rhythm: Normal rate and regular rhythm.     Pulses:          Radial pulses are 2+ on the right side  and 2+ on the left side.  Pulmonary:     Effort: Pulmonary effort is normal. No respiratory distress.  Abdominal:     General: There is no distension.     Palpations: Abdomen is soft.     Tenderness: There is no abdominal tenderness. There is no guarding or rebound.  Musculoskeletal:        General: Normal range of motion.     Cervical back: Normal range of motion.     Comments: Left hand: Superficial approximately 5 mm laceration of the tip of the left middle finger.  No bleeding.  No surrounding erythema.  Nontender.  Otherwise hand and fingers appear normal. No snuffbox tenderness to palpation. No tenderness to palpation over flexor sheath.  Finger adduction/abduction intact with 5/5 strength.  Thumb opposition intact. Full active and resisted ROM to flexion/extension at wrist, MCP, PIP and DIP of all fingers.  FDS/FDP intact. Grip 5/5 strength.  Radial artery 2+ with <2sec cap refill in all fingers.  Sensation intact to light-tough in median/ulnar/radial distributions.  Compartments soft. - Full range of motion, strength and sensation at the left wrist, elbow, shoulder and neck without overlying skin changes.  Skin:    General: Skin is warm and dry.  Neurological:     Mental Status: He is alert.     GCS: GCS eye subscore is 4. GCS verbal subscore is 5. GCS motor subscore is 6.     Comments: Speech is clear and goal oriented, follows commands Major Cranial nerves without deficit, no facial droop Moves extremities without ataxia, coordination intact  Psychiatric:        Behavior: Behavior normal.       ED Results / Procedures / Treatments   Labs (all labs ordered are listed, but only abnormal results are displayed) Labs Reviewed - No data to display  EKG None  Radiology No results found.  Procedures Procedures (including critical care time)  Medications Ordered in ED Medications  Tdap (BOOSTRIX) injection 0.5 mL (0.5 mLs Intramuscular Given 10/28/19 1729)    ED  Course  I have reviewed the triage vital signs and the nursing notes.  Pertinent labs & imaging results that were available during my care of the patient were reviewed by me and considered in my medical decision making (see chart for details).    MDM Rules/Calculators/A&P                     24 year old male presents today for a superficial  laceration to the tip of his left middle finger.  He sustained the laceration on a vegetable can lid that he was taking to the trash.  He cleaned the area thoroughly at home, he has no pain or bleeding of the area.  He felt some tingling around the laceration this morning and felt that he had some tingling at the elbow as well.  He became anxious after he did an Therapist, art about tetanus.  He reports that he is up-to-date on all childhood immunizations but cannot recall when his last tetanus shot was.  He has full range of motion and strength at the left fingers, hand, wrist, elbow and shoulder.  No pain or decreased range of motion at the neck.  No other injuries today.  No sign of cellulitis, septic arthritis, flexor tenosynovitis, DVT, compartment syndrome or other emergent pathologies. He denies any muscle spasms or additional concerns.  Will update patient's tetanus shot today.  Due to how superficial the wound is do not feel any imaging is indicated at this time, cleaned by nursing staff and bandaged with antibiotic ointment.  Patient was reassured about the signs and symptoms of tetanus, his tingling sensation was only 16 hours after he sustained laceration which is not consistent with the intubation of tetanus.  Additionally he has no spasms or other symptoms concerning for tetanus.  Finally he reports he is up-to-date on childhood immunizations.  I reassessed the patient after he received his tetanus shot today, he reports that the tingling sensations have completely resolved and he is no longer feeling anxious.  Doubt acute tetanus infection at this time.   He reports he is still worried for infection, will provide patient course of Keflex for infectious prophylaxis as he did sustain the cut on trash.  He will follow-up with his PCP for recheck this week.  At this time there does not appear to be any evidence of an acute emergency medical condition and the patient appears stable for discharge with appropriate outpatient follow up. Diagnosis was discussed with patient who verbalizes understanding of care plan and is agreeable to discharge. I have discussed return precautions with patient who verbalizes understanding of return precautions. Patient encouraged to follow-up with their PCP. All questions answered.  Note: Portions of this report may have been transcribed using voice recognition software. Every effort was made to ensure accuracy; however, inadvertent computerized transcription errors may still be present. Final Clinical Impression(s) / ED Diagnoses Final diagnoses:  Laceration of left middle finger without foreign body without damage to nail, initial encounter    Rx / DC Orders ED Discharge Orders         Ordered    cephALEXin (KEFLEX) 500 MG capsule  4 times daily     10/28/19 1848           Elizabeth Palau 10/28/19 1849    Vanetta Mulders, MD 11/03/19 [phone]

## 2019-10-28 NOTE — ED Notes (Signed)
Discharge education provided; pt verbalized understanding of prescription regimen and preventative measures to keep finger from getting infected.

## 2019-10-28 NOTE — ED Triage Notes (Signed)
Pt states he was reaching into a trash can yesterday when he cut his left middle finger on a can- pt has small laceration to middle finger. Pt states today when he woke up he felt like his left elbow felt numb but also describes a tingling in both bilateral hands and a "jittery" feeling. Pt does appear anxious in triage.

## 2019-10-28 NOTE — Discharge Instructions (Addendum)
You have been diagnosed today with laceration of the left middle finger.  At this time there does not appear to be the presence of an emergent medical condition, however there is always the potential for conditions to change. Please read and follow the below instructions.  Please return to the Emergency Department immediately for any new or worsening symptoms. Please be sure to follow up with your Primary Care Provider within one week regarding your visit today; please call their office to schedule an appointment even if you are feeling better for a follow-up visit. You may take the antibiotic Keflex as prescribed 4 times daily for the next 7 days to avoid infection.  Please drink plenty of water and get plenty of rest.  You may use an over-the-counter probiotic to help with possible upset stomach which may be due to antibiotics.  Get help right away if: You have very bad swelling around the wound. Your pain suddenly gets worse and is very bad. You notice painful lumps near the wound or anywhere on your body. You have a red streak going away from your wound. The wound is on your hand or foot, and: You cannot move a finger or toe. Your fingers or toes look pale or bluish.      6. You have any new/concerning or worsening of symptoms  Please read the additional information packets attached to your discharge summary.  Do not take your medicine if  develop an itchy rash, swelling in your mouth or lips, or difficulty breathing; call 911 and seek immediate emergency medical attention if this occurs.  Note: Portions of this text may have been transcribed using voice recognition software. Every effort was made to ensure accuracy; however, inadvertent computerized transcription errors may still be present.

## 2021-04-08 ENCOUNTER — Ambulatory Visit (HOSPITAL_COMMUNITY)
Admission: EM | Admit: 2021-04-08 | Discharge: 2021-04-08 | Disposition: A | Discharge status: Home / Self Care | Payer: No Typology Code available for payment source | Attending: Urgent Care | Admitting: Urgent Care

## 2021-04-08 ENCOUNTER — Encounter (HOSPITAL_COMMUNITY): Payer: Self-pay | Admitting: *Deleted

## 2021-04-08 ENCOUNTER — Ambulatory Visit (HOSPITAL_COMMUNITY): Payer: Self-pay

## 2021-04-08 ENCOUNTER — Other Ambulatory Visit: Payer: Self-pay

## 2021-04-08 DIAGNOSIS — R1032 Left lower quadrant pain: Secondary | ICD-10-CM | POA: Diagnosis not present

## 2021-04-08 LAB — POCT URINALYSIS DIPSTICK, ED / UC
Bilirubin Urine: NEGATIVE
Glucose, UA: NEGATIVE mg/dL
Hgb urine dipstick: NEGATIVE
Ketones, ur: NEGATIVE mg/dL
Leukocytes,Ua: NEGATIVE
Nitrite: NEGATIVE
Protein, ur: NEGATIVE mg/dL
Specific Gravity, Urine: 1.02 (ref 1.005–1.030)
Urobilinogen, UA: 0.2 mg/dL (ref 0.0–1.0)
pH: 7 (ref 5.0–8.0)

## 2021-04-08 MED ORDER — NAPROXEN 375 MG PO TABS: 375.0000 mg | ORAL_TABLET | Freq: Two times a day (BID) | ORAL | 0 refills | Status: DC

## 2021-04-08 NOTE — ED Provider Notes (Signed)
[facility]   MRN: [id number] DOB: 1996/09/01  Subjective:   Nathaniel King. is a 25 y.o. male presenting for 4-day history of persistent nagging dull left lower quadrant abdominal pain.  Currently rates the pain at a 1-2 out of 10.  Denies fever, nausea, vomiting, diarrhea, constipation, cough, chest pain, shortness of breath, dysuria, hematuria.  Denies personal history of GI issues including ulcerative colitis, Crohn, IBS, malignancy.  Denies family history of the same.  He does report family history of kidney stones.  Tries to hydrate very well on a regular basis.  Does not drink alcohol, no smoking.  No current facility-administered medications for this encounter.  Current Outpatient Medications:    ARIPiprazole (ABILIFY) 2 MG tablet, Take 1 tablet (2 mg total) by mouth daily., Disp: 30 tablet, Rfl: 0   buPROPion (WELLBUTRIN XL) 300 MG 24 hr tablet, Take 1 tablet (300 mg total) by mouth every morning., Disp: 30 tablet, Rfl: 0   cetirizine (ZYRTEC) 10 MG tablet, Take 10 mg by mouth daily as needed for allergies., Disp: , Rfl:    traZODone (DESYREL) 50 MG tablet, Take 1 tablet (50 mg total) by mouth at bedtime as needed for sleep., Disp: 30 tablet, Rfl: 0   Allergies  Allergen Reactions   Acetaminophen Rash    Past Medical History:  Diagnosis Date   Anxiety    Depression      Past Surgical History:  Procedure Laterality Date   FOOT FUSION     WISDOM TOOTH EXTRACTION      Family History  Problem Relation Age of Onset   Hypertension Mother    Alcohol abuse Maternal Aunt    Alcohol abuse Maternal Grandfather     Social History   Tobacco Use   Smoking status: Former    Packs/day: 0.25    Types: Cigarettes    Quit date: 12/25/2014    Years since quitting: 6.2   Smokeless tobacco: Never  Substance Use Topics   Alcohol use: No   Drug use: Yes    Types: Marijuana    Comment: 3-4 x a year    ROS   Objective:   Vitals: BP (!) 140/104    Pulse 84   Temp 98.2 F (36.8 C)   Resp 18   SpO2 100%   BP Readings from Last 3 Encounters:  04/08/21 (!) 140/104  10/28/19 (!) 149/87  04/23/15 125/61   Physical Exam Constitutional:      General: He is not in acute distress.    Appearance: Normal appearance. He is well-developed and normal weight. He is not ill-appearing, toxic-appearing or diaphoretic.  HENT:     Head: Normocephalic and atraumatic.     Right Ear: External ear normal.     Left Ear: External ear normal.     Nose: Nose normal.     Mouth/Throat:     Mouth: Mucous membranes are moist.     Pharynx: Oropharynx is clear.  Eyes:     General: No scleral icterus.       Right eye: No discharge.        Left eye: No discharge.     Extraocular Movements: Extraocular movements intact.     Pupils: Pupils are equal, round, and reactive to light.  Cardiovascular:     Rate and Rhythm: Normal rate and regular rhythm.     Heart sounds: Normal heart sounds. No murmur heard.   No friction rub. No gallop.  Pulmonary:  Effort: Pulmonary effort is normal. No respiratory distress.     Breath sounds: Normal breath sounds. No stridor. No wheezing, rhonchi or rales.  Abdominal:     General: Bowel sounds are normal. There is no distension.     Palpations: Abdomen is soft. There is no mass.     Tenderness: There is abdominal tenderness (mild LLQ>LUQ). There is no right CVA tenderness, left CVA tenderness, guarding or rebound.  Musculoskeletal:     Cervical back: Normal range of motion.  Skin:    General: Skin is warm and dry.  Neurological:     Mental Status: He is alert and oriented to person, place, and time.  Psychiatric:        Mood and Affect: Mood normal.        Behavior: Behavior normal.        Thought Content: Thought content normal.        Judgment: Judgment normal.    Results for orders placed or performed during the hospital encounter of 04/08/21 (from the past 24 hour(s))  POC Urinalysis dipstick     Status:  None   Collection Time: 04/08/21 10:12 AM  Result Value Ref Range   Glucose, UA NEGATIVE NEGATIVE mg/dL   Bilirubin Urine NEGATIVE NEGATIVE   Ketones, ur NEGATIVE NEGATIVE mg/dL   Specific Gravity, Urine 1.020 1.005 - 1.030   Hgb urine dipstick NEGATIVE NEGATIVE   pH 7.0 5.0 - 8.0   Protein, ur NEGATIVE NEGATIVE mg/dL   Urobilinogen, UA 0.2 0.0 - 1.0 mg/dL   Nitrite NEGATIVE NEGATIVE   Leukocytes,Ua NEGATIVE NEGATIVE     Assessment and Plan :   PDMP not reviewed this encounter.  1. Left lower quadrant abdominal pain     Undifferentiated left lower quadrant abdominal pain.  Discussed with patient differential which he has minimal risk factors for items such as Crohn's, diverticulitis, ulcerative colitis, acute abdomen.  Recommended supportive care, close follow-up with his PCP for consideration of a CT abdomen if his abdominal pain does not resolve.  Reviewed strict ER precautions. Counseled patient on potential for adverse effects with medications prescribed today, patient verbalized understanding.    Wallis Bamberg, PA-C 04/08/21 1022

## 2021-04-08 NOTE — ED Triage Notes (Signed)
Pt reports Lt sided ABD pain.

## 2021-04-08 NOTE — Discharge Instructions (Addendum)
Contact your regular doctor to see if they would like to pursue a CT scan of the abdomen/pelvis to further evaluate the mild left lower abdominal pain that she have.  In the meantime, make sure that you are hydrating very well with at least 64 ounces water, use naproxen as needed for pain and inflammation of your abdomen.  Practice high-fiber diet.  If you develop fever, severe abdominal pain and blood in your stools then please report to the emergency room.

## 2021-07-23 ENCOUNTER — Ambulatory Visit
Admission: EM | Admit: 2021-07-23 | Discharge: 2021-07-23 | Disposition: A | Discharge status: Home / Self Care | Payer: 59

## 2021-07-23 ENCOUNTER — Encounter: Payer: Self-pay | Admitting: Emergency Medicine

## 2021-07-23 ENCOUNTER — Other Ambulatory Visit: Payer: Self-pay

## 2021-07-23 DIAGNOSIS — R000 Tachycardia, unspecified: Secondary | ICD-10-CM

## 2021-07-23 DIAGNOSIS — F419 Anxiety disorder, unspecified: Secondary | ICD-10-CM | POA: Diagnosis not present

## 2021-07-23 NOTE — ED Provider Notes (Signed)
[facility]    CSN: [id number] Arrival date & time: 07/23/21  1854      History   Chief Complaint No chief complaint on file.   HPI Nathaniel King. is a 25 y.o. male.   Patient here today for evaluation of rapid heartbeat, palpitations, and lightheadedness that started at work today.  He reports that he did have some precordial chest pain that was mild but this is resolved.  He denies any shortness of breath.  He has not had any nausea or vomiting.  He denies any thoughts of wanting to harm himself.  He does have a history of anxiety.  He is not currently taking any medication for anxiety but is following up with his PCP for same.  The history is provided by the patient.   Past Medical History:  Diagnosis Date   Anxiety    Depression     Patient Active Problem List   Diagnosis Date Noted   Major depression, single episode 04/24/2015   Depression, major, recurrent, moderate ([facility]) 03/10/2015    Class: Acute   Generalized anxiety disorder 03/04/2015   Depression 03/04/2015    Past Surgical History:  Procedure Laterality Date   FOOT FUSION     WISDOM TOOTH EXTRACTION         Home Medications    Prior to Admission medications   Medication Sig Start Date End Date Taking? Authorizing Provider  cetirizine (ZYRTEC) 10 MG tablet Take 10 mg by mouth daily as needed for allergies.   Yes [provider]  fluticasone (FLONASE) 50 MCG/ACT nasal spray Place into both nostrils daily.   Yes [provider]  naproxen (NAPROSYN) 375 MG tablet Take 1 tablet (375 mg total) by mouth 2 (two) times daily. 04/08/21  Yes Wallis Bamberg, PA-C  ARIPiprazole (ABILIFY) 2 MG tablet Take 1 tablet (2 mg total) by mouth daily. 04/28/15   Thermon Leyland, NP  buPROPion (WELLBUTRIN XL) 300 MG 24 hr tablet Take 1 tablet (300 mg total) by mouth every morning. 04/28/15   Thermon Leyland, NP  traZODone (DESYREL) 50 MG tablet Take 1 tablet (50 mg total) by mouth at bedtime as  needed for sleep. 04/28/15   Thermon Leyland, NP    Family History Family History  Problem Relation Age of Onset   Hypertension Mother    Alcohol abuse Maternal Aunt    Alcohol abuse Maternal Grandfather     Social History Social History   Tobacco Use   Smoking status: Former    Packs/day: 0.25    Types: Cigarettes    Quit date: 12/25/2014    Years since quitting: 6.5   Smokeless tobacco: Never  Substance Use Topics   Alcohol use: No   Drug use: Yes    Types: Marijuana    Comment: 3-4 x a year     Allergies   Acetaminophen   Review of Systems Review of Systems  Constitutional:  Negative for chills and fever.  Eyes:  Negative for discharge and redness.  Respiratory:  Negative for shortness of breath.   Cardiovascular:  Positive for chest pain (resolved) and palpitations.  Skin:  Positive for color change and wound.  Neurological:  Negative for numbness.  Psychiatric/Behavioral:  The patient is nervous/anxious.     Physical Exam Triage Vital Signs ED Triage Vitals [07/23/21 1904]  Enc Vitals Group     BP (!) 160/95     Pulse Rate (!) 107     Resp  16     Temp 97.7 F (36.5 C)     Temp Source Oral     SpO2 98 %     Weight      Height      Head Circumference      Peak Flow      Pain Score 0     Pain Loc      Pain Edu?      Excl. in GC?    No data found.  Updated Vital Signs BP 129/90 (BP Location: Left Arm)   Pulse 85   Temp 97.7 F (36.5 C) (Oral)   Resp 16   SpO2 98%   Physical Exam Vitals and nursing note reviewed.  Constitutional:      General: He is not in acute distress.    Appearance: Normal appearance. He is not ill-appearing.  HENT:     Head: Normocephalic and atraumatic.  Eyes:     Conjunctiva/sclera: Conjunctivae normal.  Cardiovascular:     Rate and Rhythm: Normal rate and regular rhythm.     Heart sounds: Normal heart sounds. No murmur heard. Pulmonary:     Effort: Pulmonary effort is normal. No respiratory distress.      Breath sounds: Normal breath sounds. No wheezing, rhonchi or rales.  Neurological:     Mental Status: He is alert.  Psychiatric:        Mood and Affect: Mood normal.        Behavior: Behavior normal.        Thought Content: Thought content normal.     UC Treatments / Results  Labs (all labs ordered are listed, but only abnormal results are displayed) Labs Reviewed - No data to display  EKG   Radiology No results found.  Procedures Procedures (including critical care time)  Medications Ordered in UC Medications - No data to display  Initial Impression / Assessment and Plan / UC Course  I have reviewed the triage vital signs and the nursing notes.  Pertinent labs & imaging results that were available during my care of the patient were reviewed by me and considered in my medical decision making (see chart for details).  Reassured that exam was normal today.  Low suspicion for cardiac etiology of most likely related to anxiety/panic attack.  Recommended he continue to follow with his PCP and discussed this episode.  Information provided for behavioral health urgent care as well if needed.  Encouraged follow-up if symptoms worsen anyway or he has any further concerns.  Final Clinical Impressions(s) / UC Diagnoses   Final diagnoses:  Tachycardia  Anxiety     Discharge Instructions      [facility] [address] [place], [place]     ED Prescriptions   None    PDMP not reviewed this encounter.   Nathaniel Bamberger, PA-C 07/24/21 [phone]

## 2021-07-23 NOTE — ED Triage Notes (Signed)
While at work noticed his heart was racing and felt a little dizzy. Tried working through it and it got worse. States he does have a problem with anxiety, working on follow up with that with his PCP. Checking BP at home. Reports mild precordial chest pain that was intermittent, gone now. Denies SOB, N/V, fever

## 2021-07-23 NOTE — Discharge Instructions (Signed)
[facility] [address]  [place], [place]

## 2021-08-26 ENCOUNTER — Other Ambulatory Visit: Payer: Self-pay

## 2021-08-26 ENCOUNTER — Ambulatory Visit
Admission: EM | Admit: 2021-08-26 | Discharge: 2021-08-26 | Disposition: A | Discharge status: Home / Self Care | Payer: 59

## 2021-08-26 DIAGNOSIS — J069 Acute upper respiratory infection, unspecified: Secondary | ICD-10-CM | POA: Diagnosis not present

## 2021-08-26 DIAGNOSIS — Z20822 Contact with and (suspected) exposure to covid-19: Secondary | ICD-10-CM | POA: Diagnosis not present

## 2021-08-26 NOTE — ED Provider Notes (Signed)
[place]    CSN: [id number] Arrival date & time: 08/26/21  1659      History   Chief Complaint Chief Complaint  Patient presents with   Cough    HPI Nathaniel King. is a 25 y.o. male.   Patient here today for evaluation of cough, congestion, body aches, headache and fever that started yesterday.  He has  had mild sore throat.  He denies ear pain.  He has not had any vomiting or diarrhea.  He has not taken any medication for symptoms.  He states yesterday symptoms were much more mild than today.  The history is provided by the patient.  Cough Associated symptoms: fever, headaches, myalgias and sore throat   Associated symptoms: no ear pain, no eye discharge and no shortness of breath    Past Medical History:  Diagnosis Date   Anxiety    Depression     Patient Active Problem List   Diagnosis Date Noted   Major depression, single episode 04/24/2015   Depression, major, recurrent, moderate ([place]) 03/10/2015    Class: Acute   Generalized anxiety disorder 03/04/2015   Depression 03/04/2015    Past Surgical History:  Procedure Laterality Date   FOOT FUSION     WISDOM TOOTH EXTRACTION         Home Medications    Prior to Admission medications   Medication Sig Start Date End Date Taking? Authorizing Provider  cetirizine (ZYRTEC) 10 MG tablet Take 10 mg by mouth daily as needed for allergies.    [provider]  fluticasone (FLONASE) 50 MCG/ACT nasal spray Place into both nostrils daily.    [provider]  hydrOXYzine (ATARAX) 25 MG tablet Take 25 mg by mouth 4 (four) times daily as needed. 08/02/21   [provider]    Family History Family History  Problem Relation Age of Onset   Hypertension Mother    Alcohol abuse Maternal Aunt    Alcohol abuse Maternal Grandfather     Social History Social History   Tobacco Use   Smoking status: Former    Packs/day: 0.25    Types: Cigarettes    Quit date: 12/25/2014     Years since quitting: 6.6   Smokeless tobacco: Never  Substance Use Topics   Alcohol use: Yes    Comment: occ   Drug use: Not Currently    Types: Marijuana    Comment: 3-4 x a year     Allergies   Acetaminophen   Review of Systems Review of Systems  Constitutional:  Positive for fever.  HENT:  Positive for congestion and sore throat. Negative for ear pain.   Eyes:  Negative for discharge and redness.  Respiratory:  Positive for cough. Negative for shortness of breath.   Gastrointestinal:  Negative for abdominal pain, diarrhea, nausea and vomiting.  Musculoskeletal:  Positive for myalgias.  Neurological:  Positive for headaches.    Physical Exam Triage Vital Signs ED Triage Vitals  Enc Vitals Group     BP 08/26/21 1855 (!) 158/104     Pulse Rate 08/26/21 1855 (!) 112     Resp 08/26/21 1855 18     Temp 08/26/21 1855 99.9 F (37.7 C)     Temp Source 08/26/21 1855 Oral     SpO2 08/26/21 1855 98 %     Weight --      Height --      Head Circumference --      Peak Flow --  Pain Score 08/26/21 1856 5     Pain Loc --      Pain Edu? --      Excl. in [place]? --    No data found.  Updated Vital Signs BP (!) 158/104 (BP Location: Left Arm)   Pulse (!) 112   Temp 99.9 F (37.7 C) (Oral)   Resp 18   SpO2 98%      Physical Exam Vitals and nursing note reviewed.  Constitutional:      General: He is not in acute distress.    Appearance: Normal appearance. He is not ill-appearing.  HENT:     Head: Normocephalic and atraumatic.     Nose: Nose normal. No congestion.     Mouth/Throat:     Mouth: Mucous membranes are moist.     Pharynx: Oropharynx is clear. No oropharyngeal exudate or posterior oropharyngeal erythema.  Eyes:     Conjunctiva/sclera: Conjunctivae normal.  Cardiovascular:     Rate and Rhythm: Normal rate and regular rhythm.     Heart sounds: Normal heart sounds. No murmur heard. Pulmonary:     Effort: Pulmonary effort is normal. No respiratory  distress.     Breath sounds: Normal breath sounds. No wheezing, rhonchi or rales.  Skin:    General: Skin is warm and dry.  Neurological:     Mental Status: He is alert.  Psychiatric:        Mood and Affect: Mood normal.        Thought Content: Thought content normal.     UC Treatments / Results  Labs (all labs ordered are listed, but only abnormal results are displayed) Labs Reviewed  COVID-19, FLU A+B NAA    EKG   Radiology No results found.  Procedures Procedures (including critical care time)  Medications Ordered in UC Medications - No data to display  Initial Impression / Assessment and Plan / UC Course  I have reviewed the triage vital signs and the nursing notes.  Pertinent labs & imaging results that were available during my care of the patient were reviewed by me and considered in my medical decision making (see chart for details).   Suspect viral etiology of symptoms. Will order covid and flu screening. Advised symptomatic treatment, increased fluids and rest. Recommend follow up with any concerns.   Final Clinical Impressions(s) / UC Diagnoses   Final diagnoses:  Encounter for screening laboratory testing for COVID-19 virus  Acute upper respiratory infection   Discharge Instructions   None    ED Prescriptions   None    PDMP not reviewed this encounter.   Tomi Bamberger, PA-C 08/27/21 [phone]

## 2021-08-26 NOTE — ED Triage Notes (Signed)
Pt c/o cough, congestion, sore throat, and headaches since yesterday. Denies taking any meds.

## 2021-08-27 ENCOUNTER — Encounter: Payer: Self-pay | Admitting: Physician Assistant

## 2021-08-27 LAB — COVID-19, FLU A+B NAA
Influenza A, NAA: NOT DETECTED
Influenza B, NAA: NOT DETECTED
SARS-CoV-2, NAA: NOT DETECTED

## 2021-11-17 ENCOUNTER — Encounter (HOSPITAL_COMMUNITY): Payer: Self-pay

## 2021-11-17 ENCOUNTER — Ambulatory Visit (HOSPITAL_COMMUNITY)
Admission: EM | Admit: 2021-11-17 | Discharge: 2021-11-17 | Disposition: A | Discharge status: Home / Self Care | Payer: 59

## 2021-11-17 ENCOUNTER — Other Ambulatory Visit: Payer: Self-pay

## 2021-11-17 DIAGNOSIS — F419 Anxiety disorder, unspecified: Secondary | ICD-10-CM | POA: Diagnosis not present

## 2021-11-17 DIAGNOSIS — R002 Palpitations: Secondary | ICD-10-CM

## 2021-11-17 NOTE — ED Triage Notes (Signed)
Pt states feels like his heart is racing since 9am, took hydroxyzine and it help for a little while then became lightheaded. States started his new b/p meds today. Pt denies chest pain. States feels worse then his normal anxiety.

## 2021-11-17 NOTE — ED Provider Notes (Signed)
Nathaniel King    CSN: [id number] Arrival date & time: 11/17/21  1505      History   Chief Complaint Chief Complaint  Patient presents with   Palpitations    HPI Nathaniel King. is a 26 y.o. male.  Patient with a history of anxiety.  Anxiety attacks frequently include a racing heart beat.  Patient saw his PCP yesterday and was placed on Zoloft for his anxiety.  Prior to that he was just taking hydroxyzine as needed.  He started the Zoloft this morning.  Yesterday he was also started on the losartan for his blood pressure; took his first dose this morning.  Patient is here today because of a racing heart beat.  He reports he woke up this morning with it and that the onset of symptoms was prior to taking Zoloft or losartan for the first time.  He took one of his hydroxyzine and felt a little bit better but has persisted all day long.  He reports it does feel like prior anxiety.  Did not check her heart rate.  Does not know if his heart rate was irregular.   Palpitations Associated symptoms: dizziness   Associated symptoms: no chest pain, no diaphoresis, no nausea and no shortness of breath    Past Medical History:  Diagnosis Date   Anxiety    Depression     Patient Active Problem List   Diagnosis Date Noted   Major depression, single episode 04/24/2015   Depression, major, recurrent, moderate ([place]) 03/10/2015    Class: Acute   Generalized anxiety disorder 03/04/2015   Depression 03/04/2015    Past Surgical History:  Procedure Laterality Date   FOOT FUSION     WISDOM TOOTH EXTRACTION         Home Medications    Prior to Admission medications   Medication Sig Start Date End Date Taking? Authorizing Provider  cetirizine (ZYRTEC) 10 MG tablet Take 10 mg by mouth daily as needed for allergies.    [provider]  fluticasone (FLONASE) 50 MCG/ACT nasal spray Place into both nostrils daily.    [provider]  hydrOXYzine (ATARAX) 25 MG  tablet Take 25 mg by mouth 4 (four) times daily as needed. 08/02/21   [provider]  losartan (COZAAR) 25 MG tablet Take 25 mg by mouth daily. 11/16/21   [provider]  sertraline (ZOLOFT) 25 MG tablet Take 25 mg by mouth daily. 11/16/21   [provider]    Family History Family History  Problem Relation Age of Onset   Hypertension Mother    Alcohol abuse Maternal Aunt    Alcohol abuse Maternal Grandfather     Social History Social History   Tobacco Use   Smoking status: Former    Packs/day: 0.25    Types: Cigarettes    Quit date: 12/25/2014    Years since quitting: 6.9   Smokeless tobacco: Never  Substance Use Topics   Alcohol use: Yes    Comment: occ   Drug use: Not Currently    Types: Marijuana    Comment: 3-4 x a year     Allergies   Acetaminophen   Review of Systems Review of Systems  Constitutional:  Negative for diaphoresis.  Respiratory:  Negative for shortness of breath.   Cardiovascular:  Positive for palpitations. Negative for chest pain.  Gastrointestinal:  Negative for nausea.  Neurological:  Positive for dizziness.    Physical Exam Triage Vital Signs ED Triage Vitals  Enc Vitals Group     BP 11/17/21 1546 132/81     Pulse Rate 11/17/21 1546 89     Resp 11/17/21 1546 18     Temp 11/17/21 1546 98.1 F (36.7 C)     Temp Source 11/17/21 1546 Oral     SpO2 11/17/21 1546 100 %     Weight --      Height --      Head Circumference --      Peak Flow --      Pain Score 11/17/21 1547 0     Pain Loc --      Pain Edu? --      Excl. in [place]? --    No data found.  Updated Vital Signs BP 132/81 (BP Location: Right Arm)    Pulse 89    Temp 98.1 F (36.7 C) (Oral)    Resp 18    SpO2 100%   Visual Acuity Right Eye Distance:   Left Eye Distance:   Bilateral Distance:    Right Eye Near:   Left Eye Near:    Bilateral Near:     Physical Exam Constitutional:      Appearance: Normal appearance.  Cardiovascular:      Rate and Rhythm: Normal rate and regular rhythm.  Pulmonary:     Effort: Pulmonary effort is normal.     Breath sounds: Normal breath sounds.  Neurological:     Mental Status: He is alert.  Psychiatric:        Mood and Affect: Mood normal.        Behavior: Behavior normal.        Thought Content: Thought content normal.        Judgment: Judgment normal.     UC Treatments / Results  Labs (all labs ordered are listed, but only abnormal results are displayed) Labs Reviewed - No data to display  EKG EKG: normal EKG, normal sinus rhythm.    Radiology No results found.  Procedures Procedures (including critical care time)  Medications Ordered in UC Medications - No data to display  Initial Impression / Assessment and Plan / UC Course  I have reviewed the triage vital signs and the nursing notes.  Pertinent labs & imaging results that were available during my care of the patient were reviewed by me and considered in my medical decision making (see chart for details).    Patient's symptoms consistent with prior anxiety episodes.  Discussed with patient need to monitor his heart rate and regularity of his heart beat when these episodes happen.  Patient to follow-up with PCP for further care  Final Clinical Impressions(s) / UC Diagnoses   Final diagnoses:  Anxiety  Palpitations     Discharge Instructions      Monitor your heart rate by checking your pulse when you feel like your heart is racing. Keep track of your heart rate and if it is a regular steady beat or irregular. Keep taking your zoloft and follow up with your doctor.    ED Prescriptions   None    PDMP not reviewed this encounter.   Carvel Getting, NP 11/17/21 1650

## 2021-11-17 NOTE — Discharge Instructions (Addendum)
Monitor your heart rate by checking your pulse when you feel like your heart is racing. Keep track of your heart rate and if it is a regular steady beat or irregular. Keep taking your zoloft and follow up with your doctor.
